# Patient Record
Sex: Female | Born: 1970 | Race: White | Hispanic: No | Marital: Single | Smoking: Never smoker
Health system: Southern US, Community
[De-identification: ages and names within clinical notes are randomized; demographics above are authoritative.]

## PROBLEM LIST (undated history)

## (undated) DIAGNOSIS — T50901A Poisoning by unspecified drugs, medicaments and biological substances, accidental (unintentional), initial encounter: Secondary | ICD-10-CM

## (undated) DIAGNOSIS — I10 Essential (primary) hypertension: Secondary | ICD-10-CM

## (undated) DIAGNOSIS — F419 Anxiety disorder, unspecified: Secondary | ICD-10-CM

## (undated) DIAGNOSIS — F1190 Opioid use, unspecified, uncomplicated: Secondary | ICD-10-CM

## (undated) HISTORY — PX: ABDOMINAL HYSTERECTOMY: SHX81

## (undated) HISTORY — PX: VESICOVAGINAL FISTULA CLOSURE W/ TAH: SUR271

---

## 2001-12-08 ENCOUNTER — Other Ambulatory Visit: Admission: RE | Admit: 2001-12-08 | Discharge: 2001-12-08 | Payer: Self-pay | Admitting: Obstetrics and Gynecology

## 2003-10-29 ENCOUNTER — Emergency Department (HOSPITAL_COMMUNITY): Admission: EM | Admit: 2003-10-29 | Discharge: 2003-10-29 | Payer: Self-pay | Admitting: Internal Medicine

## 2004-12-29 ENCOUNTER — Ambulatory Visit (HOSPITAL_COMMUNITY): Admission: RE | Admit: 2004-12-29 | Discharge: 2004-12-29 | Payer: Self-pay | Admitting: Obstetrics and Gynecology

## 2005-02-03 ENCOUNTER — Inpatient Hospital Stay (HOSPITAL_COMMUNITY): Admission: RE | Admit: 2005-02-03 | Discharge: 2005-02-06 | Payer: Self-pay | Admitting: Obstetrics & Gynecology

## 2005-05-20 ENCOUNTER — Ambulatory Visit (HOSPITAL_COMMUNITY): Admission: RE | Admit: 2005-05-20 | Discharge: 2005-05-20 | Payer: Self-pay | Admitting: Pulmonary Disease

## 2007-04-27 ENCOUNTER — Emergency Department (HOSPITAL_COMMUNITY): Admission: EM | Admit: 2007-04-27 | Discharge: 2007-04-27 | Payer: Self-pay | Admitting: Emergency Medicine

## 2007-11-14 ENCOUNTER — Encounter (INDEPENDENT_AMBULATORY_CARE_PROVIDER_SITE_OTHER): Payer: Self-pay | Admitting: General Surgery

## 2007-11-14 ENCOUNTER — Other Ambulatory Visit: Admission: RE | Admit: 2007-11-14 | Discharge: 2007-11-14 | Payer: Self-pay | Admitting: General Surgery

## 2009-07-25 ENCOUNTER — Emergency Department (HOSPITAL_COMMUNITY): Admission: EM | Admit: 2009-07-25 | Discharge: 2009-07-25 | Payer: Self-pay | Admitting: Emergency Medicine

## 2009-10-22 ENCOUNTER — Ambulatory Visit (HOSPITAL_COMMUNITY): Admission: RE | Admit: 2009-10-22 | Discharge: 2009-10-22 | Payer: Self-pay | Admitting: Family Medicine

## 2010-06-03 ENCOUNTER — Ambulatory Visit (HOSPITAL_COMMUNITY): Admission: RE | Admit: 2010-06-03 | Discharge: 2010-06-03 | Payer: Self-pay | Admitting: Family Medicine

## 2010-08-25 ENCOUNTER — Ambulatory Visit (HOSPITAL_COMMUNITY)
Admission: RE | Admit: 2010-08-25 | Discharge: 2010-08-25 | Payer: Self-pay | Source: Home / Self Care | Admitting: Family Medicine

## 2011-01-04 ENCOUNTER — Ambulatory Visit (HOSPITAL_COMMUNITY)
Admission: RE | Admit: 2011-01-04 | Discharge: 2011-01-04 | Payer: Self-pay | Source: Home / Self Care | Attending: Family Medicine | Admitting: Family Medicine

## 2011-01-09 ENCOUNTER — Encounter: Payer: Self-pay | Admitting: Obstetrics and Gynecology

## 2011-05-07 NOTE — Discharge Summary (Signed)
NAMEGEORGEAN, SPAINHOWER              ACCOUNT NO.:  [id number]   MEDICAL RECORD NO.:  [id number]          PATIENT TYPE:  INP   LOCATION:  A419                           FACILITY:   PHYSICIAN:  Lazaro Arms, M.D.   DATE OF BIRTH:  Nov 12, 1971   DATE OF ADMISSION:  DATE OF DISCHARGE:  02/18/2006LH                                 DISCHARGE SUMMARY   DISCHARGE DIAGNOSES:  1.  Status post abdominal hysterectomy.  2.  Status post abdominoplasty.   Please refer to the transcribed history and physical in the hospital.   HOSPITAL COURSE:  The patient was admitted after surgery.  She did quite  well intraoperatively and postoperatively.  She tolerated clear liquids and  a regular diet without symptoms, was ambulatory, and remained afebrile.  She  had an appropriate amount of JP drain output.  Her incision was clean, dry  and intact.  Dressing was removed on post day 2 and not replaced.  The  drains were kept in place for the following week in order to drain the  subcutaneous fat space.  The patient, again, had a completely normal  postoperative course, and was discharged home on post day #3 in good, stable  condition to follow up with the office next week to have staples starting to  be removed and drains removed.   DISCHARGE MEDICATIONS:  She was given Tylox for pain and Levaquin as an  antibiotic.      LHE/MEDQ  D:  02/17/2005  T:  02/17/2005  Job:  [id number]

## 2011-05-07 NOTE — H&P (Signed)
NAMEMARICARMEN, [name]              ACCOUNT NO.:  [id number]   MEDICAL RECORD NO.:  [id number]          PATIENT TYPE:  AMB   LOCATION:  DAY                           FACILITY:  [facility]   PHYSICIAN:  Lazaro Arms, M.D.   DATE OF BIRTH:  02-27-1971   DATE OF ADMISSION:  DATE OF DISCHARGE:  LH                                HISTORY & PHYSICAL   Angelica Alexander is a 40 year old white female, who is status post two C sections and  a tubal ligation with her second C section, who has very heavy and very  painful periods. She has been having to take narcotics now for some time  because of her bad periods.  She has tried oral contraceptive pills with no  success.  Ultrasound of the uterus reveals a thickened endometrium and  slightly enlarged uterus, probably most consistent with adenomyosis.   On examination, she has quite a tender uterus to palpation.  The adnexa is  negative.  Angelica Alexander and I discussed options in detail, and she has decided  that she wants to go forward with a abdominal hysterectomy.  We plan to  leave the ovaries in place if they are normal.   She also has chronic moisture changes of the lower abdomen and chronic yeast  because of redundant abdominal wall from her pregnancies.  As a result, she  is requesting abdominoplasty be performed, and I certainly agree with that  medically. As a result, we will proceed with abdominoplasty as well.   PAST MEDICAL HISTORY:  Negative except for broken foot.   PAST SURGICAL HISTORY:  She has just had the two C sections and tubal  ligation.   PAST OBSTETRICAL HISTORY:  As above.   REVIEW OF SYSTEMS:  As per HPI, otherwise negative.  She has had a few  scattered urinary tract infections in the past.   PHYSICAL EXAMINATION:  VITAL SIGNS:  Weight 173 pounds.  Blood pressure  136/80.  HEENT:  Unremarkable.  NECK:  Thyroid is normal.  LUNGS:  Clear.  HEART:  Regular rate and rhythm without murmur, regurgitation, or gallop.  BREASTS:  Exam is  normal.  EXTREMITIES:  Warm with no edema.  NEUROLOGIC:  Grossly intact.  ABDOMEN:  Exam is benign, no hepatosplenomegaly or masses.  She does have  chronic moisture changes and yeast changes of lower abdomen.  PELVIC:  She has normal external genitalia.  The vagina is pink and moist  without discharge.  The cervix is parous without lesions.  The uterus is  tender in the mid plane.  The adnexa are negative.  RECTAL:  Exam is normal.   IMPRESSION:  1.  Menometrorrhagia.  2.  Dysmenorrhea.  3.  Dyspareunia.  4.  Chronic moisture changes of lower abdomen.   PLAN:  The patient is admitted for abdominal hysterectomy and  abdominoplasty.  She understands the risks, benefits, indications, and  alternatives and will proceed.      LHE/MEDQ  D:  02/02/2005  T:  02/02/2005  Job:  [id number]

## 2011-05-07 NOTE — Op Note (Signed)
NAMESHAINE, MOUNT              ACCOUNT NO.:  [id number]   MEDICAL RECORD NO.:  [id number]          PATIENT TYPE:  AMB   LOCATION:  DAY                           FACILITY:  [facility]   PHYSICIAN:  Lazaro Arms, M.D.   DATE OF BIRTH:  1971/03/15   DATE OF PROCEDURE:  02/03/2005  DATE OF DISCHARGE:                                 OPERATIVE REPORT   PREOPERATIVE DIAGNOSES:  1.  Menometrorrhagia.  2.  Dysmenorrhea.  3.  Dyspareunia.  4.  Small fibroids.  5.  Chronic moisture changes, abdominal wall.   POSTOPERATIVE DIAGNOSES:  1.  Menometrorrhagia.  2.  Dysmenorrhea.  3.  Dyspareunia.  4.  Small fibroids.  5.  Chronic moisture changes, abdominal wall.  6.  Adenomyosis and endometriosis, minimal.   OPERATION PERFORMED:  1.  Abdominal hysterectomy.  2.  Abdominoplasty.   SURGEON:  Lazaro Arms, M.D.   ANESTHESIA:  General endotracheal.   FINDINGS:  The patient had sort of a globally enlarged uterus.  It was very  soft, consistent with adenomyosis.  She had a few specks of endometriosis  that would be classified as minimal in the posterior peritoneum and  uterosacral ligaments bilaterally.  All of it was removed with the specimen.  The ovaries were normal.  The rest of the peritoneal cavity was normal.   DESCRIPTION OF PROCEDURE:  The patient was taken to the operating room and  placed in a supine position where she underwent general endotracheal  anesthesia.  I then spent a good deal of time drawing on the abdomen in  preparation for the planned abdominoplasty. The vagina was prepped, Foley  catheter was placed and the abdomen was prepped in the usual sterile  fashion.  Routine Pfannenstiel incision was made and carried down sharply to  the rectus fascia which was scored in the midline and extended laterally.  The fascia was taken off the muscles superiorly and inferiorly without  difficulty.  The muscles were divided and the peritoneal cavity was entered.  A Balfour  self-retaining retractor was placed and the upper abdomen was  packed away without difficulty.  The uterine cornu were grasped.  The left  round ligament was suture ligated and cut.  The utero-ovarian ligament was  double clamped, cut and double suture ligated with good hemostasis.  The  vesicouterine serosal flap was created on the left.  There were minimal  adhesions despite the patient's history of two cesarean sections.  The right  round ligament was suture ligated and cut.  The utero-ovarian ligament was  cross-clamped, cut and double suture ligated with good hemostasis and the  vesicouterine serosal flap was united in the midline from the right.  Further dissection was performed of the bladder and it was taken down off  the lower uterine segment without difficulty.  The posterior peritoneum was  then cut and the uterine vessels were skeletonized bilaterally.  The uterine  vessels were clamped, cut and suture ligated.  Serial pedicles were taken  down the cervix through the cardinal ligament. Each pedicle being clamped,  cut and transfixion suture ligated.  The vaginal angles were approached.  Curved clamps were placed.  Vaginal angle sutures were placed.  The specimen  was removed and the vagina was closed with interrupted figure of eight  sutures.  The pelvis was irrigated vigorously.  There was good hemostasis of  all pedicles.  The ovaries were wanting to get down next to the vagina so  they were attached loosely to the round ligaments bilaterally to keep them  away from the vaginal cuff.  Interceed was also placed over the vaginal cuff  to prevent postoperative adhesions.  The Balfour retractor was removed. The  packs were removed and all pedicles found to be hemostatic.  The muscles and  peritoneum were reapproximated loosely. The fascia was closed using 0 Vicryl  running.  Subcutaneous tissue was made hemostatic.  I then manually tunneled  under the subcutaneous tissue after  incising it with the electrocautery unit  and tunneled underneath the skin through the subcu beyond the point of my  planned abdominoplasty.  I then sharply removed the skin as drawn on the  abdomen to the iliac crest bilaterally and pretty much halfway between the  pubis and umbilicus in the midline in a smiling fashion.  The abdominal wall  was removed.  Hemostasis was achieved.  Two Jackson-Pratt drains were placed  on either side and sutured down.  Again, subcutaneous tissue was made  hemostatic and irrigated to keep moist.  Subcutaneous sutures were placed  interrupted to bring together the subcutaneous tissue to reapproximate the  skin edges.  When the skin edges were placed together, I then used staples  to close the skin.  The blood loss for the procedure was 300 mL.  The  patient tolerated the procedure well.  She received Ancef 1 g  prophylactically.  A pressure was applied.  An abdominal binder was applied  again to try to create tension on the abdominal wall from the abdominoplasty  to diminish postoperative blood loss from the subcutaneous tissue.  The  drains were draining fine.  The patient was awakened from anesthesia and  taken to recovery room in good stable condition.  All counts were correct.      LHE/MEDQ  D:  02/03/2005  T:  02/03/2005  Job:  [id number]

## 2011-05-08 ENCOUNTER — Emergency Department (HOSPITAL_COMMUNITY)
Admission: EM | Admit: 2011-05-08 | Discharge: 2011-05-08 | Disposition: A | Discharge status: Home / Self Care | Payer: Medicaid Other | Attending: Emergency Medicine | Admitting: Emergency Medicine

## 2011-05-08 DIAGNOSIS — R51 Headache: Secondary | ICD-10-CM | POA: Insufficient documentation

## 2011-05-08 DIAGNOSIS — X58XXXA Exposure to other specified factors, initial encounter: Secondary | ICD-10-CM | POA: Insufficient documentation

## 2011-05-08 DIAGNOSIS — S81009A Unspecified open wound, unspecified knee, initial encounter: Secondary | ICD-10-CM | POA: Insufficient documentation

## 2011-06-03 ENCOUNTER — Emergency Department (HOSPITAL_COMMUNITY)
Admission: EM | Admit: 2011-06-03 | Discharge: 2011-06-03 | Disposition: A | Discharge status: Home / Self Care | Payer: Medicaid Other | Attending: Emergency Medicine | Admitting: Emergency Medicine

## 2011-06-03 ENCOUNTER — Emergency Department (HOSPITAL_COMMUNITY): Payer: Medicaid Other

## 2011-06-03 DIAGNOSIS — I10 Essential (primary) hypertension: Secondary | ICD-10-CM | POA: Insufficient documentation

## 2011-06-03 DIAGNOSIS — IMO0002 Reserved for concepts with insufficient information to code with codable children: Secondary | ICD-10-CM | POA: Insufficient documentation

## 2011-06-03 DIAGNOSIS — W268XXA Contact with other sharp object(s), not elsewhere classified, initial encounter: Secondary | ICD-10-CM | POA: Insufficient documentation

## 2011-06-03 DIAGNOSIS — F329 Major depressive disorder, single episode, unspecified: Secondary | ICD-10-CM | POA: Insufficient documentation

## 2011-06-03 DIAGNOSIS — G43909 Migraine, unspecified, not intractable, without status migrainosus: Secondary | ICD-10-CM | POA: Insufficient documentation

## 2011-06-03 DIAGNOSIS — F3289 Other specified depressive episodes: Secondary | ICD-10-CM | POA: Insufficient documentation

## 2011-06-03 DIAGNOSIS — Y998 Other external cause status: Secondary | ICD-10-CM | POA: Insufficient documentation

## 2011-06-03 DIAGNOSIS — Z79899 Other long term (current) drug therapy: Secondary | ICD-10-CM | POA: Insufficient documentation

## 2012-02-17 ENCOUNTER — Emergency Department (HOSPITAL_COMMUNITY): Payer: Self-pay

## 2012-02-17 ENCOUNTER — Emergency Department (HOSPITAL_COMMUNITY)
Admission: EM | Admit: 2012-02-17 | Discharge: 2012-02-17 | Disposition: A | Discharge status: Home / Self Care | Payer: Self-pay | Attending: Emergency Medicine | Admitting: Emergency Medicine

## 2012-02-17 ENCOUNTER — Other Ambulatory Visit: Payer: Self-pay

## 2012-02-17 ENCOUNTER — Encounter (HOSPITAL_COMMUNITY): Payer: Self-pay | Admitting: Emergency Medicine

## 2012-02-17 DIAGNOSIS — R0789 Other chest pain: Secondary | ICD-10-CM | POA: Insufficient documentation

## 2012-02-17 DIAGNOSIS — I10 Essential (primary) hypertension: Secondary | ICD-10-CM | POA: Insufficient documentation

## 2012-02-17 DIAGNOSIS — H538 Other visual disturbances: Secondary | ICD-10-CM | POA: Insufficient documentation

## 2012-02-17 HISTORY — DX: Anxiety disorder, unspecified: F41.9

## 2012-02-17 HISTORY — DX: Essential (primary) hypertension: I10

## 2012-02-17 LAB — BASIC METABOLIC PANEL
BUN: 15 mg/dL (ref 6–23)
CO2: 27 mEq/L (ref 19–32)
Calcium: 10.3 mg/dL (ref 8.4–10.5)
Chloride: 100 mEq/L (ref 96–112)
Creatinine, Ser: 0.97 mg/dL (ref 0.50–1.10)
GFR calc Af Amer: 85 mL/min — ABNORMAL LOW (ref >90)
GFR calc non Af Amer: 73 mL/min — ABNORMAL LOW (ref >90)
Glucose, Bld: 114 mg/dL — ABNORMAL HIGH (ref 70–99)
Potassium: 3.7 mEq/L (ref 3.5–5.1)
Sodium: 137 mEq/L (ref 135–145)

## 2012-02-17 LAB — CBC
HCT: 41 % (ref 36.0–46.0)
Hemoglobin: 13.7 g/dL (ref 12.0–15.0)
MCH: 29.8 pg (ref 26.0–34.0)
MCHC: 33.4 g/dL (ref 30.0–36.0)
MCV: 89.1 fL (ref 78.0–100.0)
Platelets: 324 10*3/uL (ref 150–400)
RBC: 4.6 MIL/uL (ref 3.87–5.11)
RDW: 13.4 % (ref 11.5–15.5)
WBC: 8.7 10*3/uL (ref 4.0–10.5)

## 2012-02-17 LAB — TROPONIN I: Troponin I: <0.3 ng/mL (ref <0.30)

## 2012-02-17 NOTE — ED Provider Notes (Signed)
History   Scribed for [employer]. Colon Branch, MD, the patient was seen in [facility]. The chart was scribed by Gilman Schmidt. The patients care was started at 9:42 PM.   CSN: [id number]  Arrival date & time 02/17/12  2111   First MD Initiated Contact with Patient 02/17/12 2123      Chief Complaint  Patient presents with  . Chest Pain  . Hypertension  . Blurred Vision    (Consider location/radiation/quality/duration/timing/severity/associated sxs/prior treatment) HPI Angelica Alexander is a 41 y.o. female with a history of HTN and Anxiety who presents to the Emergency Department complaining of chest pain onset three days. States it feels like someone is "holding her chest and squeezing it". Symptoms have worsened in last two weeks. Pt also notes elevated blood pressure levels and blurred vision (being unable to focus). Pt states she does not sleep and notes recent stress. States she was on Ambien but stopped taking it because she did not want to get addicted. Notes occasional swelling in hands and feet that subside during the night. Pt also notes that she was on Lisinopril but has stopped taking it.  There are no other associated symptoms and no other alleviating or aggravating factors.   PCP: Dr. Sudie Bailey Past Medical History  Diagnosis Date  . Hypertension   . Anxiety     Past Surgical History  Procedure Date  . Vesicovaginal fistula closure w/ tah     No family history on file.  History  Substance Use Topics  . Smoking status: Never Smoker   . Smokeless tobacco: Not on file  . Alcohol Use: Yes     socially    OB History    Grav Para Term Preterm Abortions TAB SAB Ect Mult Living                  Review of Systems  Eyes:       Blurred vision   Cardiovascular: Positive for chest pain.  All other systems reviewed and are negative.    Allergies  Review of patient's allergies indicates no known allergies.  Home Medications  No current outpatient prescriptions on  file.  BP 151/94  Pulse 72  Temp(Src) 98 F (36.7 C) (Oral)  Resp 20  Ht 5\' 4"  (1.626 m)  Wt 190 lb (86.183 kg)  BMI 32.61 kg/m2  SpO2 100%  Physical Exam  Constitutional: She appears well-developed and well-nourished.  HENT:  Head: Normocephalic and atraumatic.  Eyes: Conjunctivae are normal. Pupils are equal, round, and reactive to light.  Neck: Neck supple. No tracheal deviation present. No thyromegaly present.  Cardiovascular: Normal rate and regular rhythm.   No murmur heard. Pulmonary/Chest: Effort normal and breath sounds normal.  Abdominal: Soft. Bowel sounds are normal. She exhibits no distension. There is no tenderness.  Musculoskeletal: Normal range of motion. She exhibits no edema and no tenderness.  Neurological: She is alert. Coordination normal.  Skin: Skin is warm and dry. No rash noted.  Psychiatric: She has a normal mood and affect.    ED Course  Procedures (including critical care time)   Labs Reviewed  CBC  BASIC METABOLIC PANEL  TROPONIN I   No results found.   No diagnosis found.  DIAGNOSTIC STUDIES: Oxygen Saturation is 100% on room air, normal by my interpretation.    Date: 02/17/2012  2127  Rate:72 Rhythm: normal sinus rhythm  QRS Axis: normal  Intervals: normal  ST/T Wave abnormalities: normal  Conduction Disutrbances: none  Narrative Interpretation:  Old EKG Reviewed:NONE AVAILABLE  LABS Results for orders placed during the hospital encounter of 02/17/12  CBC      Component Value Range   WBC 8.7  4.0 - 10.5 (K/uL)   RBC 4.60  3.87 - 5.11 (MIL/uL)   Hemoglobin 13.7  12.0 - 15.0 (g/dL)   HCT 47.8  29.5 - 62.1 (%)   MCV 89.1  78.0 - 100.0 (fL)   MCH 29.8  26.0 - 34.0 (pg)   MCHC 33.4  30.0 - 36.0 (g/dL)   RDW 30.8  65.7 - 84.6 (%)   Platelets 324  150 - 400 (K/uL)  BASIC METABOLIC PANEL      Component Value Range   Sodium 137  135 - 145 (mEq/L)   Potassium 3.7  3.5 - 5.1 (mEq/L)   Chloride 100  96 - 112 (mEq/L)   CO2 27   19 - 32 (mEq/L)   Glucose, Bld 114 (*) 70 - 99 (mg/dL)   BUN 15  6 - 23 (mg/dL)   Creatinine, Ser 9.62  0.50 - 1.10 (mg/dL)   Calcium 95.2  8.4 - 10.5 (mg/dL)   GFR calc non Af Amer 73 (*) >90 (mL/min)   GFR calc Af Amer 85 (*) >90 (mL/min)  TROPONIN I      Component Value Range   Troponin I <0.30  <0.30 (ng/mL)    Radiology: Chest Portable 1 View. Reviewed by me. IMPRESSION: Negative chest. Original Report Authenticated By: Elsie Stain, M.D.   COORDINATION OF CARE: 9:42pm:  - Patient evaluated by ED physician, Chest Portable, Troponin, CBC, BMP, EKG ordered     MDM  Patient with chest discomfort and elevated blood pressure for several days. Formerly on antihypertensive. EKG, chest xray and labs are unremarkable. Pt stable in ED with no significant deterioration in condition.The patient appears reasonably screened and/or stabilized for discharge and I doubt any other medical condition or other [facility] requiring further screening, evaluation, or treatment in the ED at this time prior to discharge.  I personally performed the services described in this documentation, which was scribed in my presence. The recorded information has been reviewed and considered.   MDM Reviewed: nursing note and vitals Interpretation: labs, ECG and x-ray           Nicoletta Dress. Colon Branch, MD 02/17/12 2243

## 2012-02-17 NOTE — ED Notes (Signed)
Patient states that she has had chest pressure for about 3 days now. States that her blood pressure has been elevated as well. Also states that her vision is going blurry today and stress makes the pain worse.

## 2012-02-17 NOTE — Discharge Instructions (Signed)
YOUR BLOOD WORK, HEART NUMBERS, EKG AND CHEST XRAY WERE ALL NORMAL HERE TONIGHT. TAKE YOUR BLOOD PRESSURES FOR TWO WEEKS AND TAKE THE READING TO DR. Sudie Bailey. HE MAY WANT TO RESTART BLOOD PRESSURE MEDICINE.    Chest Pain, Nonspecific Today you have had an exam and tests to determine a specific cause for your chest pain. It is often hard to give a specific diagnosis as the cause of one's chest pain. There is always a chance that your pain could be related to something serious, like a heart attack or a blood clot in the lungs. You need to follow up with your caregiver for further evaluation. More lab tests or other studies such as x-rays, an electrocardiogram, stress testing, or cardiac imaging may be needed to find the cause of your pain. Most of the time nonspecific chest pain will be improved within 2-3 days of rest and mild pain medicine. For the next few days avoid physical exertion or activities that bring on the pain. Do not smoke or drink alcohol until all your symptoms are gone. Quitting smoking is the number one way to reduce your risk for heart and lung disease. Call your caregiver for routine follow-up as advised.  SEEK IMMEDIATE MEDICAL CARE IF:  You develop increased chest pain, or pain that radiates to the arm, neck, jaw, back or abdomen.   You develop shortness of breath, increasing cough or coughing up blood.   You have severe back or abdominal pain, nausea or vomiting.   You develop severe weakness, fainting, fever or chills.  Document Released: 12/06/2005 Document Revised: 08/18/2011 Document Reviewed: 05/26/2007 [facility] Patient Information 2012 [place], [place].

## 2012-11-29 ENCOUNTER — Ambulatory Visit (HOSPITAL_COMMUNITY)
Admission: RE | Admit: 2012-11-29 | Discharge: 2012-11-29 | Disposition: A | Discharge status: Home / Self Care | Payer: BC Managed Care – PPO | Source: Ambulatory Visit | Attending: Family Medicine | Admitting: Family Medicine

## 2012-11-29 ENCOUNTER — Other Ambulatory Visit (HOSPITAL_COMMUNITY): Payer: Self-pay | Admitting: Family Medicine

## 2012-11-29 DIAGNOSIS — M25559 Pain in unspecified hip: Secondary | ICD-10-CM

## 2012-11-29 DIAGNOSIS — M545 Low back pain, unspecified: Secondary | ICD-10-CM | POA: Insufficient documentation

## 2013-09-30 ENCOUNTER — Emergency Department (HOSPITAL_COMMUNITY)
Admission: EM | Admit: 2013-09-30 | Discharge: 2013-09-30 | Disposition: A | Discharge status: Home / Self Care | Payer: BC Managed Care – PPO | Attending: Emergency Medicine | Admitting: Emergency Medicine

## 2013-09-30 ENCOUNTER — Encounter (HOSPITAL_COMMUNITY): Payer: Self-pay | Admitting: Emergency Medicine

## 2013-09-30 DIAGNOSIS — Z8659 Personal history of other mental and behavioral disorders: Secondary | ICD-10-CM | POA: Insufficient documentation

## 2013-09-30 DIAGNOSIS — I10 Essential (primary) hypertension: Secondary | ICD-10-CM | POA: Insufficient documentation

## 2013-09-30 DIAGNOSIS — Z880 Allergy status to penicillin: Secondary | ICD-10-CM | POA: Insufficient documentation

## 2013-09-30 DIAGNOSIS — S01512A Laceration without foreign body of oral cavity, initial encounter: Secondary | ICD-10-CM

## 2013-09-30 DIAGNOSIS — S01502A Unspecified open wound of oral cavity, initial encounter: Secondary | ICD-10-CM | POA: Insufficient documentation

## 2013-09-30 MED ORDER — HYDROCODONE-ACETAMINOPHEN 5-325 MG PO TABS
1.0000 | ORAL_TABLET | Freq: Once | ORAL | Status: AC
  Administered 2013-09-30: 1 via ORAL
  Filled 2013-09-30: qty 1

## 2013-09-30 MED ORDER — HYDROCODONE-ACETAMINOPHEN 5-325 MG PO TABS: 1.0000 | ORAL_TABLET | ORAL | Status: DC | PRN
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
## 2013-09-30 NOTE — ED Notes (Signed)
Patient given crackers per request.

## 2013-09-30 NOTE — ED Notes (Signed)
Small bruise noted on the left upper eyelid of patient. Patient states it is "a little sore"
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
## 2013-10-03 NOTE — ED Provider Notes (Signed)
Medical screening examination/treatment/procedure(s) were performed by non-physician practitioner and as supervising physician I was immediately available for consultation/collaboration.  Elmira Olkowski F Ashly Goethe, MD 10/03/13 1541 

## 2013-11-01 ENCOUNTER — Ambulatory Visit (HOSPITAL_COMMUNITY)
Admission: RE | Admit: 2013-11-01 | Discharge: 2013-11-01 | Disposition: A | Discharge status: Home / Self Care | Payer: BC Managed Care – PPO | Source: Ambulatory Visit | Attending: Family Medicine | Admitting: Family Medicine

## 2013-11-01 ENCOUNTER — Other Ambulatory Visit (HOSPITAL_COMMUNITY): Payer: Self-pay | Admitting: Family Medicine

## 2013-11-01 DIAGNOSIS — M545 Low back pain, unspecified: Secondary | ICD-10-CM

## 2013-11-01 DIAGNOSIS — M25559 Pain in unspecified hip: Secondary | ICD-10-CM | POA: Insufficient documentation

## 2013-11-01 DIAGNOSIS — M2550 Pain in unspecified joint: Secondary | ICD-10-CM

## 2013-12-13 IMAGING — CR DG HIP W/ PELVIS BILAT
5 series · 5 of 5 positions shown · non-contrast
Comparison: None.

CLINICAL DATA: Lumbago, bilateral hip pain.

EXAM:
BILATERAL HIP WITH PELVIS - 4+ VIEW

[view not recorded (1 of 5)]
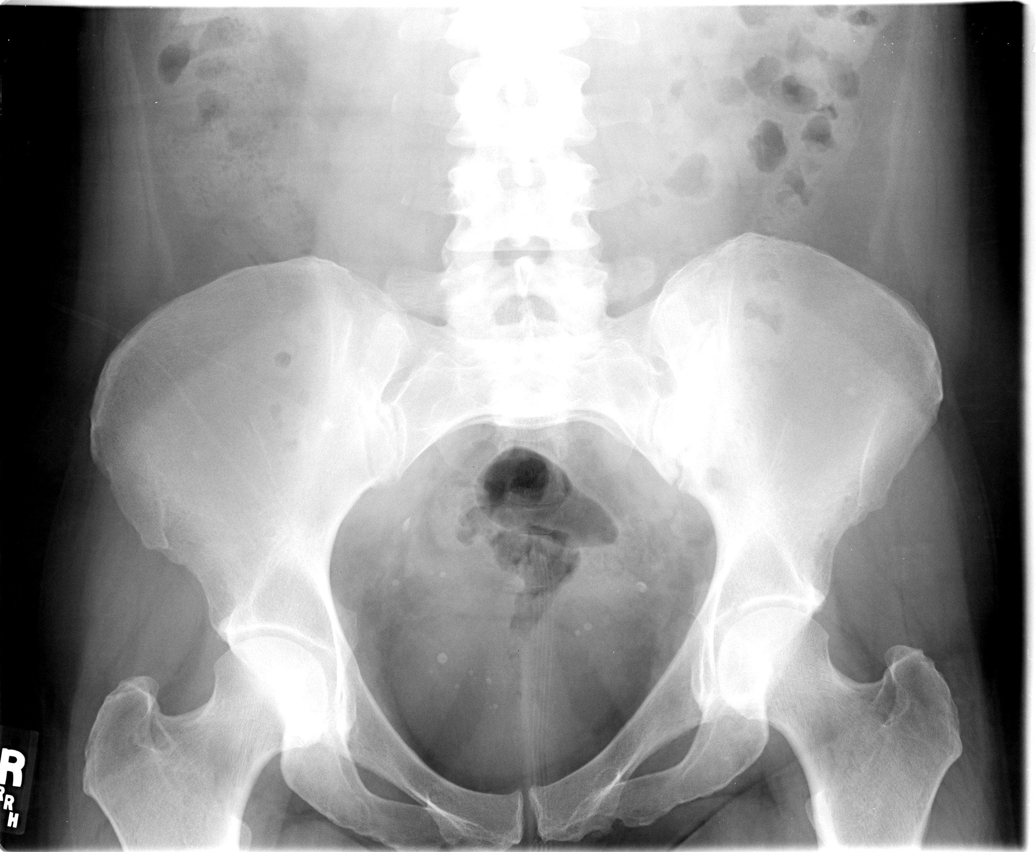

[view not recorded (2 of 5)]
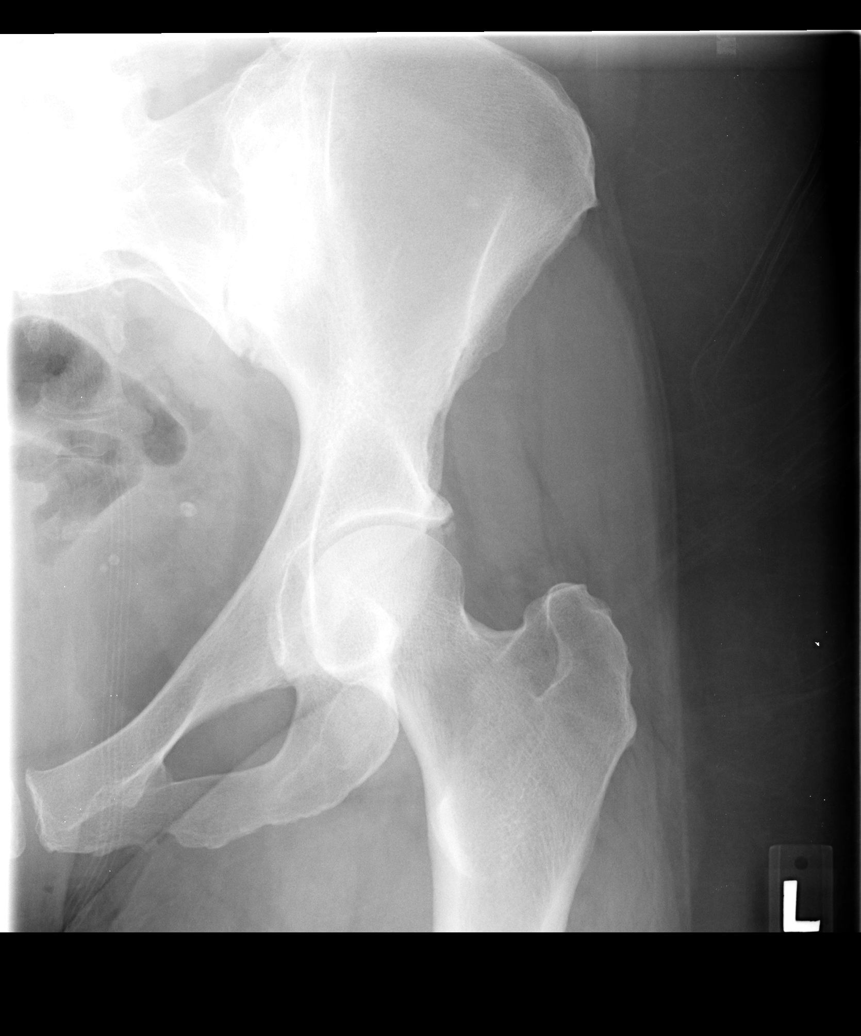

[view not recorded (3 of 5)]
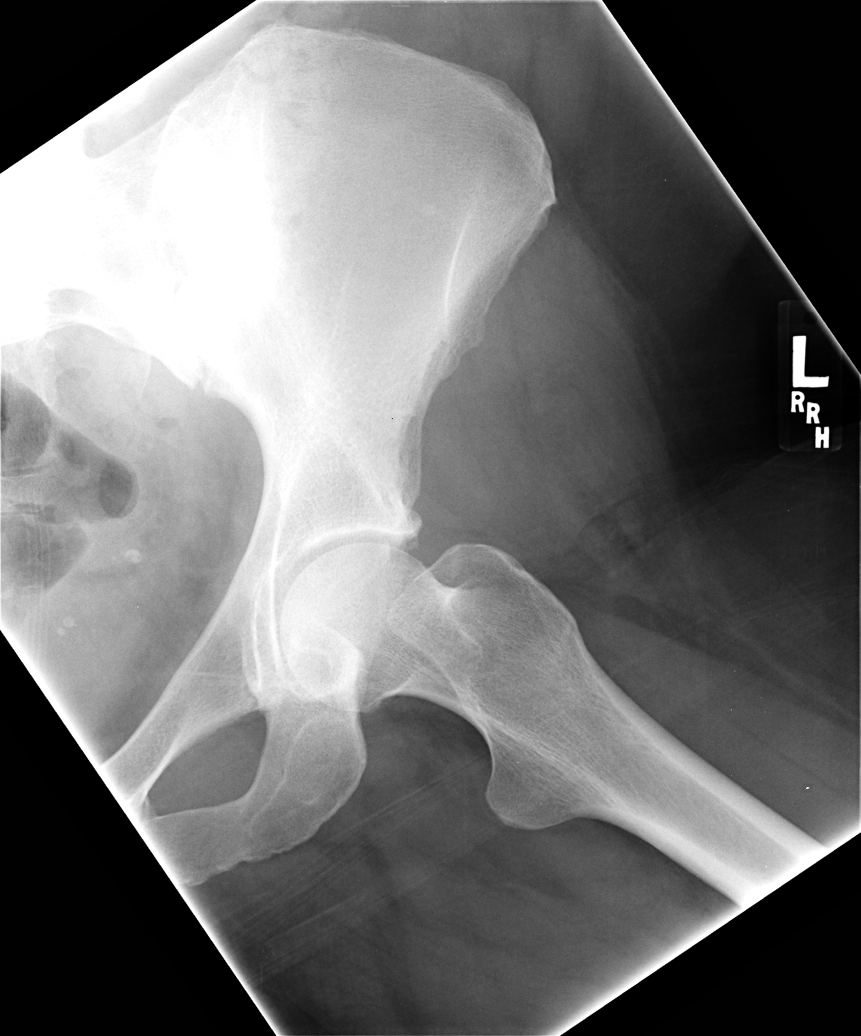

[view not recorded (4 of 5)]
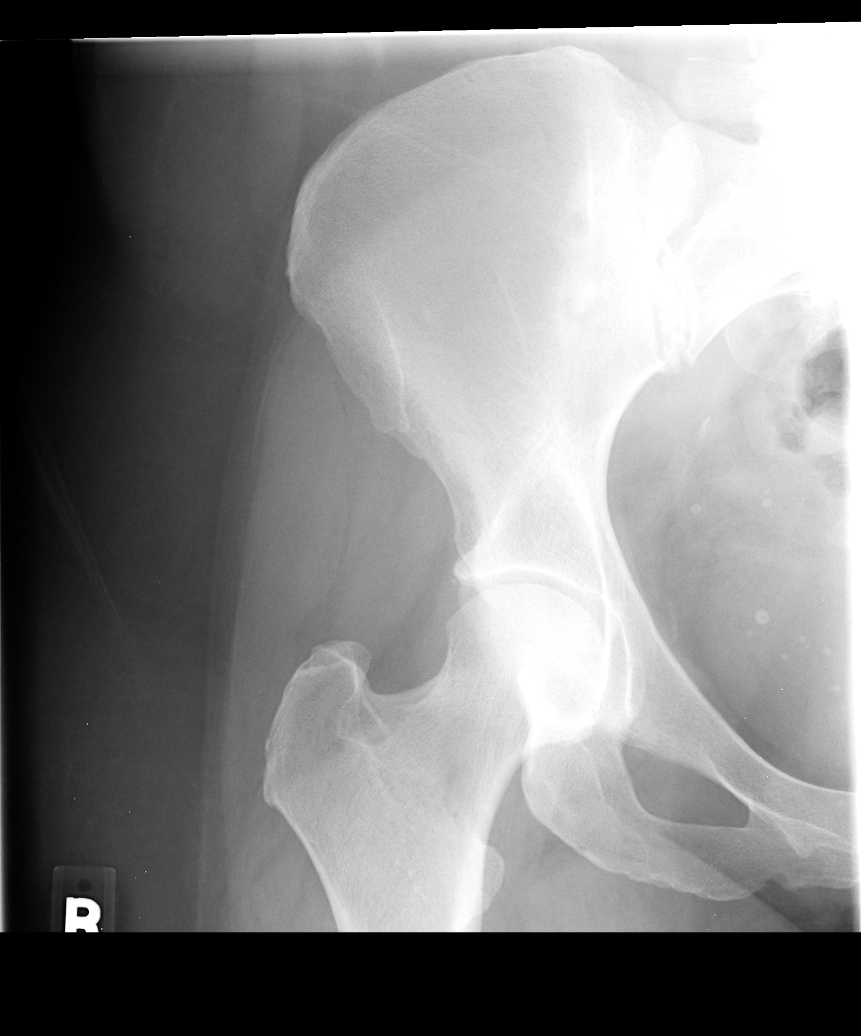

[view not recorded (5 of 5)]
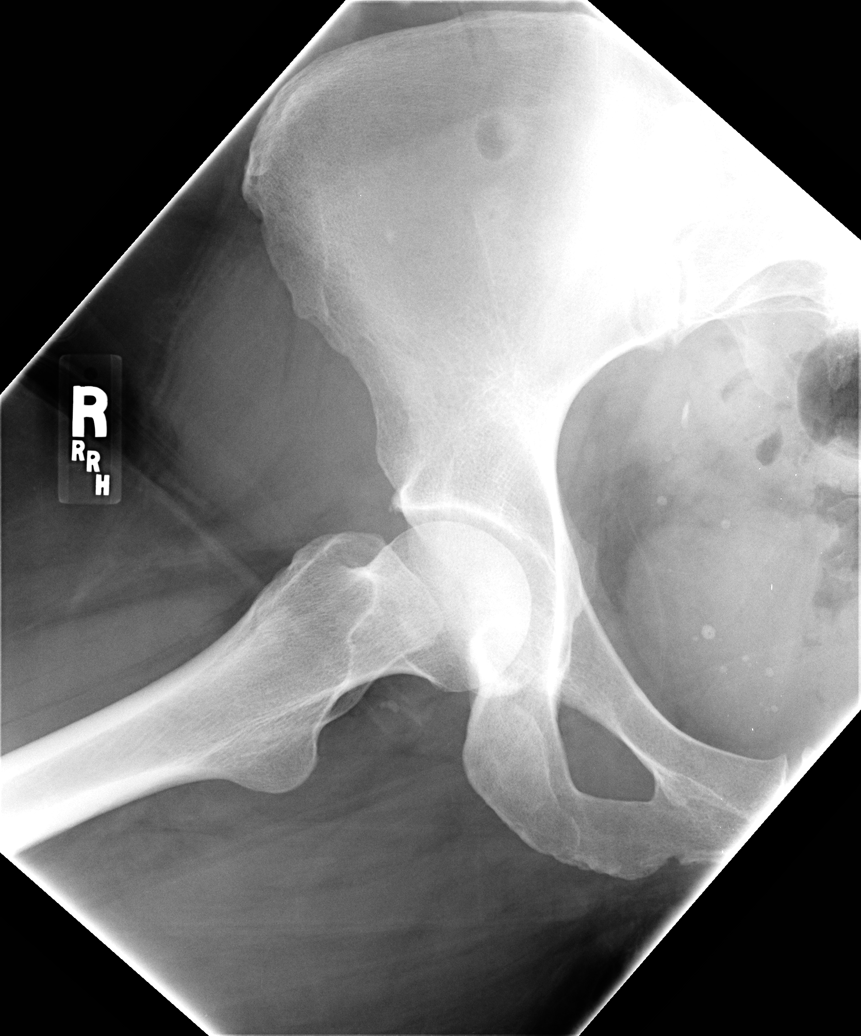

[5 of 5 positions shown; findings below may reference images not displayed]

FINDINGS: There is no acute fracture or dislocation. The joint spaces are
maintained. The SI joints are normal.
IMPRESSION: No acute osseous injury of bilateral hips.

## 2014-01-14 ENCOUNTER — Other Ambulatory Visit (HOSPITAL_COMMUNITY): Payer: Self-pay | Admitting: Family Medicine

## 2014-01-14 DIAGNOSIS — Z139 Encounter for screening, unspecified: Secondary | ICD-10-CM

## 2014-01-18 ENCOUNTER — Ambulatory Visit (HOSPITAL_COMMUNITY): Payer: BC Managed Care – PPO

## 2014-04-09 ENCOUNTER — Telehealth: Payer: Self-pay | Admitting: Gastroenterology

## 2014-04-09 ENCOUNTER — Ambulatory Visit: Payer: BC Managed Care – PPO | Admitting: Gastroenterology

## 2014-04-09 ENCOUNTER — Encounter: Payer: Self-pay | Admitting: Gastroenterology

## 2014-04-09 NOTE — Telephone Encounter (Signed)
Pt was a no show

## 2014-04-09 NOTE — Telephone Encounter (Signed)
MAILED LETTER °

## 2014-06-03 ENCOUNTER — Emergency Department (HOSPITAL_COMMUNITY)
Admission: EM | Admit: 2014-06-03 | Discharge: 2014-06-03 | Discharge status: Against Medical Advice | Payer: BC Managed Care – PPO

## 2018-03-17 ENCOUNTER — Other Ambulatory Visit (HOSPITAL_COMMUNITY): Payer: Self-pay | Admitting: *Deleted

## 2018-03-17 DIAGNOSIS — IMO0002 Reserved for concepts with insufficient information to code with codable children: Secondary | ICD-10-CM

## 2018-03-17 DIAGNOSIS — R229 Localized swelling, mass and lump, unspecified: Principal | ICD-10-CM

## 2018-03-28 ENCOUNTER — Ambulatory Visit (HOSPITAL_COMMUNITY)
Admission: RE | Admit: 2018-03-28 | Discharge: 2018-03-28 | Disposition: A | Discharge status: Home / Self Care | Payer: PRIVATE HEALTH INSURANCE | Source: Ambulatory Visit | Attending: *Deleted | Admitting: *Deleted

## 2018-03-28 ENCOUNTER — Encounter (HOSPITAL_COMMUNITY): Payer: Self-pay

## 2018-03-28 DIAGNOSIS — N644 Mastodynia: Secondary | ICD-10-CM | POA: Diagnosis present

## 2018-03-28 DIAGNOSIS — N6002 Solitary cyst of left breast: Secondary | ICD-10-CM | POA: Diagnosis not present

## 2018-03-28 DIAGNOSIS — IMO0002 Reserved for concepts with insufficient information to code with codable children: Secondary | ICD-10-CM

## 2018-03-28 DIAGNOSIS — R229 Localized swelling, mass and lump, unspecified: Principal | ICD-10-CM

## 2018-03-28 DIAGNOSIS — N6321 Unspecified lump in the left breast, upper outer quadrant: Secondary | ICD-10-CM | POA: Insufficient documentation

## 2018-03-28 DIAGNOSIS — N6320 Unspecified lump in the left breast, unspecified quadrant: Secondary | ICD-10-CM | POA: Diagnosis present

## 2018-04-27 ENCOUNTER — Encounter: Payer: Self-pay | Admitting: General Surgery

## 2018-04-27 ENCOUNTER — Ambulatory Visit (INDEPENDENT_AMBULATORY_CARE_PROVIDER_SITE_OTHER): Payer: Self-pay | Admitting: General Surgery

## 2018-04-27 VITALS — BP 119/74 | HR 81 | Temp 98.6°F | Resp 16 | Wt 168.0 lb

## 2018-04-27 DIAGNOSIS — N6002 Solitary cyst of left breast: Secondary | ICD-10-CM

## 2018-04-27 NOTE — Patient Instructions (Signed)
Breast Cyst A breast cyst is a sac in the breast that is filled with fluid. Breast cysts are usually noncancerous (benign). They are common among women, and they are most often located in the upper, outer portion of the breast. One or more cysts may develop. They form when fluid builds up inside of the breast glands. There are several types of breast cysts:  Macrocyst. This is a cyst that is about 2 inches (5.1 cm) across (in diameter).  Microcyst. This is a very small cyst that you cannot feel, but it can be seen with imaging tests such as an X-ray of the breast (mammogram) or ultrasound.  Galactocele. This is a cyst that contains milk. It may develop if you suddenly stop breastfeeding.  Breast cysts do not increase your risk of breast cancer. They usually disappear after menopause, unless you take artificial hormones (are on hormone therapy). What are the causes? The exact cause of breast cysts is not known. Possible causes include:  Blockage of tubes (ducts) in the breast glands, which leads to fluid buildup. Duct blockage may result from: ? Fibrocystic breast changes. This is a common, benign condition that occurs when women go through hormonal changes during the menstrual cycle. This is a common cause of multiple breast cysts. ? Overgrowth of breast tissue or breast glands. ? Scar tissue in the breast from previous surgery.  Changes in certain female hormones (estrogen and progesterone).  What increases the risk? You may be more likely to develop breast cysts if you have not gone through menopause. What are the signs or symptoms? Symptoms of a breast cyst may include:  Feeling one or more smooth, round, soft lumps (like grapes) in the breast that are easily moveable. The lump(s) may get bigger and more painful before your period and get smaller after your period.  Breast discomfort or pain.  How is this diagnosed? A cyst can be felt during a physical exam by your health care  provider. A mammogram and ultrasound will be done to confirm the diagnosis. Fluid may be removed from the cyst with a needle (fine-needle aspiration) and tested to make sure the cyst is not cancerous. How is this treated? Treatment may not be necessary. Your health care provider may monitor the cyst to see if it goes away on its own. If the cyst is uncomfortable or gets bigger, or if you do not like how the cyst makes your breast look, you may need treatment. Treatment may include:  Hormone treatment.  Fine-needle aspiration, to drain fluid from the cyst. There is a chance of the cyst coming back (recurring) after aspiration.  Surgery to remove the cyst.  Follow these instructions at home:  See your health care provider regularly. ? Get a yearly physical exam. ? If you are 20-40 years old, get a clinical breast exam every 1-3 years. After age 40, get this exam every year. ? Get mammograms as often as directed.  Do a breast self-exam every month, or as often as directed. Having many breast cysts, or "lumpy" breasts, may make it harder to feel for new lumps. Understand how your breasts normally look and feel, and write down any changes in your breasts so you can tell your health care provider about the changes. A breast self-exam involves: ? Comparing your breasts in the mirror. ? Looking for visible changes in your skin or nipples. ? Feeling for lumps or changes.  Take over-the-counter and prescription medicines only as told by your health care   provider.  Wear a supportive bra, especially when exercising.  Follow instructions from your health care provider about eating and drinking restrictions. ? Avoid caffeine. ? Cut down on salt (sodium) in what you eat and drink, especially before your menstrual period. Too much sodium can cause fluid buildup (retention), breast swelling, and discomfort.  Keep all follow-up visits as told your health care provider. This is important. Contact a  health care provider if:  You feel, or think you feel, a lump in your breast.  You notice that both breasts look or feel different than usual.  Your breast is still causing pain after your menstrual period is over.  You find new lumps or bumps that were not there before.  You feel lumps in your armpit (axilla). Get help right away if:  You have severe pain, tenderness, redness, or warmth in your breast.  You have fluid or blood leaking from your nipple.  Your breast lump becomes hard and painful.  You notice dimpling or wrinkling of the breast or nipple. This information is not intended to replace advice given to you by your health care provider. Make sure you discuss any questions you have with your health care provider. Document Released: 12/06/2005 Document Revised: 08/27/2016 Document Reviewed: 08/27/2016 Elsevier Interactive Patient Education  2017 Elsevier Inc.  

## 2018-04-27 NOTE — Progress Notes (Signed)
Angelica Alexander; [id number]; Mar 15, 1971   HPI Patient is a 47 year old white female who was referred to my care by Dr. Sudie Bailey for evaluation and treatment of left breast cyst.  This was found on routine mammography.  Ultrasound confirmed that this was a left breast cyst, 1 cm in size, and benign in nature.  Patient states that the cyst seems to come and go, but it is not an area where there are multiple nodules present.  She does have family history of breast cancer in relatives of her mother and father.  No immediate family history of breast cancer.  She denies any nipple discharge.  She currently has 0 out of 10 breast pain. Past Medical History:  Diagnosis Date  . Anxiety   . Hypertension     Past Surgical History:  Procedure Laterality Date  . ABDOMINAL HYSTERECTOMY    . CESAREAN SECTION    . VESICOVAGINAL FISTULA CLOSURE W/ TAH      Family History  Problem Relation Age of Onset  . Breast cancer Maternal Aunt   . Breast cancer Maternal Grandmother     Current Outpatient Medications on File Prior to Visit  Medication Sig Dispense Refill  . HYDROcodone-acetaminophen (NORCO/VICODIN) 5-325 MG per tablet Take 1 tablet by mouth every 4 (four) hours as needed for pain. 15 tablet 0   No current facility-administered medications on file prior to visit.     Allergies  Allergen Reactions  . Penicillins     CHILDHOOD ALLERGIES    Social History   Substance and Sexual Activity  Alcohol Use Yes   Comment: socially    Social History   Tobacco Use  Smoking Status Never Smoker  Smokeless Tobacco Never Used    Review of Systems  Constitutional: Positive for malaise/fatigue.  HENT: Negative.   Eyes: Positive for blurred vision.  Respiratory: Negative.   Cardiovascular: Negative.   Gastrointestinal: Negative.   Genitourinary: Negative.   Musculoskeletal: Positive for back pain.  Skin: Negative.   Neurological: Negative.   Endo/Heme/Allergies: Negative.    Psychiatric/Behavioral: Negative.     Objective   Vitals:   04/27/18 0909  BP: 119/74  Pulse: 81  Resp: 16  Temp: 98.6 F (37 C)    Physical Exam  Constitutional: She is oriented to person, place, and time. She appears well-developed and well-nourished.  HENT:  Head: Normocephalic and atraumatic.  Neck: Normal range of motion. Neck supple.  Cardiovascular: Normal rate, regular rhythm and normal heart sounds. Exam reveals no gallop and no friction rub.  No murmur heard. Pulmonary/Chest: Effort normal and breath sounds normal. No stridor. No respiratory distress. She has no wheezes. She has no rales.  Lymphadenopathy:    She has no cervical adenopathy.  Neurological: She is alert and oriented to person, place, and time.  Skin: Skin is warm and dry.  Vitals reviewed. Breast: No dominant mass, nipple discharge, dimpling in either breast.  It is difficult to discern the cystic lesion in the left breast.  No axillary lymphadenopathy noted.    Mammogram and ultrasound reports reviewed  Assessment  Left breast cyst, benign Remote family history of breast cancer Plan   I reassured the patient that the cyst does not appear malignant in nature.  Should she still remain concerned, a follow-up ultrasound the left breast can be done in 6 months.  Should she have enlargement of the area, she was instructed to return to my office for examination.  She should maintain her yearly screening mammograms.  Follow-up expectantly.

## 2018-06-01 ENCOUNTER — Encounter (HOSPITAL_COMMUNITY): Payer: Self-pay | Admitting: Emergency Medicine

## 2018-06-01 ENCOUNTER — Emergency Department (HOSPITAL_COMMUNITY)
Admission: EM | Admit: 2018-06-01 | Discharge: 2018-06-01 | Disposition: A | Discharge status: Home / Self Care | Payer: Worker's Compensation | Attending: Emergency Medicine | Admitting: Emergency Medicine

## 2018-06-01 ENCOUNTER — Emergency Department (HOSPITAL_COMMUNITY): Payer: Worker's Compensation

## 2018-06-01 ENCOUNTER — Other Ambulatory Visit: Payer: Self-pay

## 2018-06-01 DIAGNOSIS — Y9289 Other specified places as the place of occurrence of the external cause: Secondary | ICD-10-CM | POA: Diagnosis not present

## 2018-06-01 DIAGNOSIS — I10 Essential (primary) hypertension: Secondary | ICD-10-CM | POA: Insufficient documentation

## 2018-06-01 DIAGNOSIS — W010XXA Fall on same level from slipping, tripping and stumbling without subsequent striking against object, initial encounter: Secondary | ICD-10-CM | POA: Diagnosis not present

## 2018-06-01 DIAGNOSIS — Y990 Civilian activity done for income or pay: Secondary | ICD-10-CM | POA: Diagnosis not present

## 2018-06-01 DIAGNOSIS — Y939 Activity, unspecified: Secondary | ICD-10-CM | POA: Diagnosis not present

## 2018-06-01 DIAGNOSIS — M25562 Pain in left knee: Secondary | ICD-10-CM | POA: Diagnosis not present

## 2018-06-01 DIAGNOSIS — R2242 Localized swelling, mass and lump, left lower limb: Secondary | ICD-10-CM | POA: Diagnosis not present

## 2018-06-01 MED ORDER — IBUPROFEN 600 MG PO TABS: 600.0000 mg | ORAL_TABLET | Freq: Four times a day (QID) | ORAL | 0 refills | Status: DC | PRN

## 2018-06-01 NOTE — ED Provider Notes (Signed)
[facility] EMERGENCY DEPARTMENT Provider Note   CSN: [id number] Arrival date & time: 06/01/18  1446     History   Chief Complaint Chief Complaint  Patient presents with  . Knee Pain    HPI Angelica Alexander is a 47 y.o. female.  Angelica Alexander is a 47 y.o. Female with a history of anxiety and hypertension, who presents to the emergency department for evaluation of a fall 2 nights ago at work.  Patient reports she fell landing on her left knee, did not hit her head, denies any other injuries.  Immediately after the fall her knee felt fine but over the past day or so it has become increasingly painful with some mild swelling.  Pain is primarily located over the anterior and lateral aspects of the knee.  Pain is made worse with palpation, range of motion and weightbearing.  She denies any redness or warmth.  No fevers or chills.  Patient has been using an Ace wrap and took some ibuprofen just prior to arrival with minimal improvement.  She denies any numbness or tingling.  No pain in the ankle, foot or hip.  No prior surgery or injury to the knee.     Past Medical History:  Diagnosis Date  . Anxiety   . Hypertension     There are no active problems to display for this patient.   Past Surgical History:  Procedure Laterality Date  . ABDOMINAL HYSTERECTOMY    . CESAREAN SECTION    . VESICOVAGINAL FISTULA CLOSURE W/ TAH       OB History   None      Home Medications    Prior to Admission medications   Medication Sig Start Date End Date Taking? Authorizing Provider  HYDROcodone-acetaminophen (NORCO/VICODIN) 5-325 MG per tablet Take 1 tablet by mouth every 4 (four) hours as needed for pain. 09/30/13   Burgess AmorIdol, Julie, PA-C    Family History Family History  Problem Relation Age of Onset  . Breast cancer Maternal Aunt   . Breast cancer Maternal Grandmother     Social History Social History   Tobacco Use  . Smoking status: Never Smoker  . Smokeless tobacco: Never  Used  Substance Use Topics  . Alcohol use: Yes    Comment: socially  . Drug use: No     Allergies   Penicillins   Review of Systems Review of Systems  Constitutional: Negative for chills and fever.  Musculoskeletal: Positive for arthralgias and joint swelling.  Skin: Negative for color change and rash.  Neurological: Negative for weakness and numbness.     Physical Exam Updated Vital Signs BP (!) 133/91 (BP Location: Right Arm)   Pulse 89   Temp 98.1 F (36.7 C) (Oral)   Resp 14   Ht 5\' 3"  (1.6 m)   Wt 77.1 kg (170 lb)   SpO2 100%   BMI 30.11 kg/m   Physical Exam  Constitutional: She appears well-developed and well-nourished. No distress.  HENT:  Head: Normocephalic and atraumatic.  Eyes: Right eye exhibits no discharge. Left eye exhibits no discharge.  Pulmonary/Chest: Effort normal. No respiratory distress.  Musculoskeletal:  Left knee with mild swelling, tenderness to palpation over the anterior and lateral aspects, no appreciable erythema, ecchymosis, or abrasion.  No warmth.  Patient is able to flex and extend the knee with some discomfort.  No pain at the hips or ankle with normal range of motion.  No appreciable swelling or tenderness over the calf.  2+  DP and TP pulses, sensation intact.  5/5 strength with dorsi and plantar flexion  Neurological: She is alert. Coordination normal.  Skin: Skin is warm and dry. Capillary refill takes less than 2 seconds. She is not diaphoretic.  Psychiatric: She has a normal mood and affect. Her behavior is normal.  Nursing note and vitals reviewed.    ED Treatments / Results  Labs (all labs ordered are listed, but only abnormal results are displayed) Labs Reviewed - No data to display  EKG None  Radiology Dg Knee Complete 4 Views Left  Result Date: 06/01/2018 CLINICAL DATA:  Fall 1 day ago with persistent knee pain, initial encounter EXAM: LEFT KNEE - COMPLETE 4+ VIEW COMPARISON:  None. FINDINGS: No evidence of  fracture, dislocation, or joint effusion. No evidence of arthropathy or other focal bone abnormality. Soft tissues are unremarkable. IMPRESSION: No acute abnormality noted. Electronically Signed   By: Alcide Clever M.D.   On: 06/01/2018 15:36    Procedures Procedures (including critical care time)  Medications Ordered in ED Medications - No data to display   Initial Impression / Assessment and Plan / ED Course  I have reviewed the triage vital signs and the nursing notes.  Pertinent labs & imaging results that were available during my care of the patient were reviewed by me and considered in my medical decision making (see chart for details).  Presents for evaluation of left knee pain after fall at work 2 nights ago.  Pain has been worsening over the past day mild swelling appreciated.  No evidence of joint effusion.  No erythema or warmth.  No concern for septic arthritis.  Range of motion intact with some discomfort.  The left lower extremity is neurovascularly intact.  X-ray shows no evidence of acute abnormality.  Likely soft tissue injury from fall.  Will place patient in knee immobilizer and crutches, encourage her to use ibuprofen and Tylenol as well as ice and elevation.  If pain not improving with conservative treatment patient to follow-up with orthopedics.  Return precautions discussed.  Patient expresses understanding and is in agreement with plan.  Final Clinical Impressions(s) / ED Diagnoses   Final diagnoses:  Acute pain of left knee    ED Discharge Orders        Ordered    ibuprofen (ADVIL,MOTRIN) 600 MG tablet  Every 6 hours PRN     06/01/18 1616       Dartha Lodge, [place] 06/01/18 1618    Mancel Bale, MD 06/02/18 0002

## 2018-06-01 NOTE — Discharge Instructions (Signed)
Your x-ray shows no evidence of fracture or dislocation.  Please use knee immobilizer and crutches.  May continue to use Tylenol and ibuprofen for pain as well as over-the-counter muscle rubs such as Biofreeze.  Please ice and elevate the knee as much as possible.  If your symptoms are not improving after 5 to 7 days please follow-up with Dr. Romeo AppleHarrison with orthopedics.  Return to the emergency department for significantly worsening pain, redness, swelling, fevers, numbness or weakness or any other new or concerning symptoms.

## 2018-06-01 NOTE — ED Triage Notes (Signed)
Patient c/o left knee pain. Per patient fell last night at work. Patient states she thought it would get better but pain and swelling is progressively getting worse. Patient reports taking 800mg  ibuprofen 1 hour ago with no relief. Patient has knee wrapped with ace bandage.

## 2018-06-07 ENCOUNTER — Telehealth: Payer: Self-pay | Admitting: Orthopedic Surgery

## 2018-06-07 NOTE — Telephone Encounter (Signed)
Patient called to relay she had been treated at [facility] room for a knee injury, which is work-related/workers comp. Relayed worker's comp protocol which includes approval from [employer] insurer.  Patient states has paperwork from employer, which she may bring to our office and/or have employer or insurance contact person call our office.

## 2018-06-12 NOTE — Telephone Encounter (Signed)
Spoke with patient again on Friday, 06/09/18; patient had not yet heard anything from her employer or workers comp, no has our office. Aware can be scheduled once we are contacted directly, and once approval is received

## 2020-01-03 ENCOUNTER — Other Ambulatory Visit: Payer: Self-pay

## 2020-01-07 ENCOUNTER — Other Ambulatory Visit: Payer: Self-pay

## 2020-01-07 ENCOUNTER — Ambulatory Visit: Payer: Self-pay | Attending: Internal Medicine

## 2020-01-07 DIAGNOSIS — U071 COVID-19: Secondary | ICD-10-CM | POA: Insufficient documentation

## 2020-01-07 DIAGNOSIS — Z20822 Contact with and (suspected) exposure to covid-19: Secondary | ICD-10-CM

## 2020-01-08 LAB — NOVEL CORONAVIRUS, NAA: SARS-CoV-2, NAA: DETECTED — AB

## 2021-05-12 ENCOUNTER — Other Ambulatory Visit (HOSPITAL_COMMUNITY): Payer: Self-pay | Admitting: Nurse Practitioner

## 2021-05-12 DIAGNOSIS — N63 Unspecified lump in unspecified breast: Secondary | ICD-10-CM

## 2021-06-16 ENCOUNTER — Ambulatory Visit (HOSPITAL_COMMUNITY): Payer: Self-pay

## 2021-06-16 ENCOUNTER — Encounter (HOSPITAL_COMMUNITY): Payer: Self-pay

## 2021-06-16 ENCOUNTER — Inpatient Hospital Stay (HOSPITAL_COMMUNITY): Admission: RE | Admit: 2021-06-16 | Payer: Self-pay | Source: Ambulatory Visit

## 2023-02-06 ENCOUNTER — Other Ambulatory Visit: Payer: Self-pay

## 2023-02-06 ENCOUNTER — Emergency Department (HOSPITAL_COMMUNITY)
Admission: EM | Admit: 2023-02-06 | Discharge: 2023-02-06 | Disposition: A | Discharge status: Home / Self Care | Payer: Self-pay | Attending: Emergency Medicine | Admitting: Emergency Medicine

## 2023-02-06 ENCOUNTER — Encounter (HOSPITAL_COMMUNITY): Payer: Self-pay

## 2023-02-06 ENCOUNTER — Emergency Department (HOSPITAL_COMMUNITY): Payer: Self-pay

## 2023-02-06 DIAGNOSIS — T402X1A Poisoning by other opioids, accidental (unintentional), initial encounter: Secondary | ICD-10-CM | POA: Insufficient documentation

## 2023-02-06 DIAGNOSIS — T50901A Poisoning by unspecified drugs, medicaments and biological substances, accidental (unintentional), initial encounter: Secondary | ICD-10-CM

## 2023-02-06 HISTORY — DX: Poisoning by unspecified drugs, medicaments and biological substances, accidental (unintentional), initial encounter: T50.901A

## 2023-02-06 HISTORY — DX: Opioid use, unspecified, uncomplicated: F11.90

## 2023-02-06 LAB — CBC WITH DIFFERENTIAL/PLATELET
Abs Immature Granulocytes: 0.23 10*3/uL — ABNORMAL HIGH (ref 0.00–0.07)
Basophils Absolute: 0.1 10*3/uL (ref 0.0–0.1)
Basophils Relative: 1 %
Eosinophils Absolute: 0.8 10*3/uL — ABNORMAL HIGH (ref 0.0–0.5)
Eosinophils Relative: 7 %
HCT: 44.4 % (ref 36.0–46.0)
Hemoglobin: 14.3 g/dL (ref 12.0–15.0)
Immature Granulocytes: 2 %
Lymphocytes Relative: 12 %
Lymphs Abs: 1.6 10*3/uL (ref 0.7–4.0)
MCH: 28.1 pg (ref 26.0–34.0)
MCHC: 32.2 g/dL (ref 30.0–36.0)
MCV: 87.2 fL (ref 80.0–100.0)
Monocytes Absolute: 0.6 10*3/uL (ref 0.1–1.0)
Monocytes Relative: 5 %
Neutro Abs: 9.3 10*3/uL — ABNORMAL HIGH (ref 1.7–7.7)
Neutrophils Relative %: 73 %
Platelets: 278 10*3/uL (ref 150–400)
RBC: 5.09 MIL/uL (ref 3.87–5.11)
RDW: 14.1 % (ref 11.5–15.5)
WBC: 12.6 10*3/uL — ABNORMAL HIGH (ref 4.0–10.5)
nRBC: 0 % (ref 0.0–0.2)

## 2023-02-06 LAB — COMPREHENSIVE METABOLIC PANEL
ALT: 27 U/L (ref 0–44)
AST: 29 U/L (ref 15–41)
Albumin: 4.1 g/dL (ref 3.5–5.0)
Alkaline Phosphatase: 68 U/L (ref 38–126)
Anion gap: 13 (ref 5–15)
BUN: 19 mg/dL (ref 6–20)
CO2: 24 mmol/L (ref 22–32)
Calcium: 9.8 mg/dL (ref 8.9–10.3)
Chloride: 100 mmol/L (ref 98–111)
Creatinine, Ser: 1.03 mg/dL — ABNORMAL HIGH (ref 0.44–1.00)
GFR, Estimated: >60 mL/min (ref >60)
Glucose, Bld: 126 mg/dL — ABNORMAL HIGH (ref 70–99)
Potassium: 3.4 mmol/L — ABNORMAL LOW (ref 3.5–5.1)
Sodium: 137 mmol/L (ref 135–145)
Total Bilirubin: 0.5 mg/dL (ref 0.3–1.2)
Total Protein: 7.4 g/dL (ref 6.5–8.1)

## 2023-02-06 LAB — ETHANOL: Alcohol, Ethyl (B): <10 mg/dL (ref <10)

## 2023-02-06 MED ORDER — SODIUM CHLORIDE 0.9 % IV BOLUS
1000.0000 mL | Freq: Once | INTRAVENOUS | Status: AC
  Administered 2023-02-06: 1000 mL via INTRAVENOUS

## 2023-02-06 NOTE — ED Notes (Signed)
Patient Alert and oriented to baseline. Stable and ambulatory to baseline. Patient verbalized understanding of the discharge instructions.  Patient belongings were taken by the patient.   

## 2023-02-06 NOTE — ED Provider Notes (Signed)
9:02 AM Patient awake, alert, requesting discharge.   Carmin Muskrat, MD 02/06/23 [id number]

## 2023-02-06 NOTE — ED Notes (Signed)
Instructed family to encourage pt to provide urine sample, as this is the last pending test. Fluids running at this time. Pt has purewick in place.

## 2023-02-06 NOTE — ED Triage Notes (Signed)
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 Pt is alert and oriented upon arrival. Pt has bloody drainage from [place] Pel[place]Pt on room air at this time.

## 2023-02-06 NOTE — ED Notes (Signed)
Pt has family at bedside

## 2023-02-06 NOTE — Discharge Instructions (Signed)
Monitor your condition carefully and do not hesitate to return here for concerning changes in your condition.    Please follow-up with your physician.

## 2023-02-06 NOTE — ED Notes (Signed)
Pt alert to verbal. Alert and oriented X4. Family remains at bedside.

## 2023-02-06 NOTE — ED Notes (Signed)
2 warm blankets applied over pt

## 2023-02-06 NOTE — ED Provider Notes (Signed)
[facility]  Provider Note  CSN: [id number] Arrival date & time: 02/06/23 0242  History Chief Complaint  Patient presents with   Drug Overdose    Angelica Alexander is a 52 y.o. female brought by EMS for suspected accidental drug overdose. EMS reports they were called for CPR in progress. Found patient unresponsive and apneic with bystander CPR. She was noted to have pin point pupils, drug paraphernalia around the house and family reported history of heroin abuse. She was given narcan with return to consciousness and subsequently stable vitals. She reports she does not remember anything including any prior drug use.    Home Medications Prior to Admission medications   Medication Sig Start Date End Date Taking? Authorizing Provider  HYDROcodone-acetaminophen (NORCO/VICODIN) 5-325 MG per tablet Take 1 tablet by mouth every 4 (four) hours as needed for pain. 09/30/13   Idol, Almyra Free, PA-C  ibuprofen (ADVIL,MOTRIN) 600 MG tablet Take 1 tablet (600 mg total) by mouth every 6 (six) hours as needed. 06/01/18   Jacqlyn Larsen, PA-C     Allergies    Penicillins   Review of Systems   Review of Systems Please see HPI for pertinent positives and negatives  Physical Exam BP (!) 172/105   Pulse 89   Temp (!) 97.2 F (36.2 C) (Oral)   Resp 16   Ht 5' 3"$  (1.6 m)   Wt 77.1 kg   SpO2 98%   BMI 30.11 kg/m   Physical Exam Vitals and nursing note reviewed.  Constitutional:      Appearance: Normal appearance.  HENT:     Head: Normocephalic and atraumatic.     Nose: Nose normal.     Mouth/Throat:     Mouth: Mucous membranes are moist.  Eyes:     Extraocular Movements: Extraocular movements intact.     Conjunctiva/sclera: Conjunctivae normal.  Cardiovascular:     Rate and Rhythm: Normal rate.  Pulmonary:     Effort: Pulmonary effort is normal.     Breath sounds: Normal breath sounds.  Abdominal:     General: Abdomen is flat.      Palpations: Abdomen is soft.     Tenderness: There is no abdominal tenderness.  Musculoskeletal:        General: No swelling. Normal range of motion.     Cervical back: Neck supple.  Skin:    General: Skin is warm and dry.  Neurological:     General: No focal deficit present.     Mental Status: She is alert.     Cranial Nerves: No cranial nerve deficit.     Sensory: No sensory deficit.     Motor: No weakness.  Psychiatric:        Mood and Affect: Mood normal.     ED Results / Procedures / Treatments   EKG None  Procedures Procedures  Medications Ordered in the ED Medications  sodium chloride 0.9 % bolus 1,000 mL ( Intravenous Rate/Dose Verify 02/06/23 [id number])    Initial Impression and Plan  Patient here with suspected accidental opioid overdose. Awake and alert now. No recent ED visits, will check labs and continue to observe in the ED for re-sedation.   ED Course   Clinical Course as of 02/06/23 0724  Sun Feb 06, 2023  0354 CBC with mild leukocytosis, otherwise normal.  [CS]  0405 CMP is unremarkable. EtOH is neg.  [CS]  [id number] I personally viewed the images from radiology studies and  agree with radiologist interpretation: CXR is clear [CS]  [phone] Care of the patient signed out to Dr. Vanita Panda at the change of shift pending waking up and clearance for discharge.  [CS]    Clinical Course User Index [CS] Truddie Hidden, MD     MDM Rules/Calculators/A&P Medical Decision Making Problems Addressed: Accidental drug overdose, initial encounter: acute illness or injury  Amount and/or Complexity of Data Reviewed Labs: ordered. Decision-making details documented in ED Course. Radiology: ordered and independent interpretation performed. Decision-making details documented in ED Course.     Final Clinical Impression(s) / ED Diagnoses Final diagnoses:  Accidental drug overdose, initial encounter    Rx / DC Orders ED Discharge Orders     None        Truddie Hidden, MD 02/06/23 [phone]

## 2023-07-25 ENCOUNTER — Ambulatory Visit: Payer: Self-pay | Admitting: Internal Medicine

## 2023-07-27 ENCOUNTER — Encounter: Payer: Self-pay | Admitting: Family Medicine

## 2023-08-16 ENCOUNTER — Emergency Department (HOSPITAL_COMMUNITY): Admission: EM | Admit: 2023-08-16 | Discharge: 2023-08-16 | Discharge status: Against Medical Advice | Payer: Self-pay

## 2023-09-03 ENCOUNTER — Emergency Department (HOSPITAL_COMMUNITY)
Admission: EM | Admit: 2023-09-03 | Discharge: 2023-09-04 | Disposition: A | Discharge status: Home / Self Care | Payer: Self-pay | Attending: Emergency Medicine | Admitting: Emergency Medicine

## 2023-09-03 ENCOUNTER — Other Ambulatory Visit: Payer: Self-pay

## 2023-09-03 ENCOUNTER — Encounter (HOSPITAL_COMMUNITY): Payer: Self-pay

## 2023-09-03 DIAGNOSIS — T40711A Poisoning by cannabis, accidental (unintentional), initial encounter: Secondary | ICD-10-CM | POA: Insufficient documentation

## 2023-09-03 DIAGNOSIS — R4182 Altered mental status, unspecified: Secondary | ICD-10-CM | POA: Insufficient documentation

## 2023-09-03 DIAGNOSIS — T50901A Poisoning by unspecified drugs, medicaments and biological substances, accidental (unintentional), initial encounter: Secondary | ICD-10-CM

## 2023-09-03 DIAGNOSIS — T402X1A Poisoning by other opioids, accidental (unintentional), initial encounter: Secondary | ICD-10-CM | POA: Insufficient documentation

## 2023-09-03 DIAGNOSIS — T424X1A Poisoning by benzodiazepines, accidental (unintentional), initial encounter: Secondary | ICD-10-CM | POA: Insufficient documentation

## 2023-09-03 NOTE — ED Provider Notes (Signed)
[place] EMERGENCY DEPARTMENT AT [facility] Provider Note   CSN: [id number] Arrival date & time: 09/03/23  2341     History {Add pertinent medical, surgical, social history, OB history to HPI:1} Chief Complaint  Patient presents with   Drug Overdose    Angelica Alexander is a 52 y.o. female.  Brought to the emergency department by husband who suspects that she overdosed.  He thinks that she took fentanyl or heroin.  Patient somnolent at arrival, awakens during triage process.  Patient reports that she "hurts all over".       Home Medications Prior to Admission medications   Medication Sig Start Date End Date Taking? Authorizing Provider  HYDROcodone-acetaminophen (NORCO/VICODIN) 5-325 MG per tablet Take 1 tablet by mouth every 4 (four) hours as needed for pain. 09/30/13   Idol, Raynelle Fanning, PA-C  ibuprofen (ADVIL,MOTRIN) 600 MG tablet Take 1 tablet (600 mg total) by mouth every 6 (six) hours as needed. 06/01/18   Dartha Lodge, PA-C      Allergies    Penicillins    Review of Systems   Review of Systems  Physical Exam Updated Vital Signs Temp 97.9 F (36.6 C) (Oral)  Physical Exam Vitals and nursing note reviewed.  Constitutional:      General: She is not in acute distress.    Appearance: She is well-developed.  HENT:     Head: Normocephalic and atraumatic.     Mouth/Throat:     Mouth: Mucous membranes are moist.  Eyes:     General: Vision grossly intact. Gaze aligned appropriately.     Extraocular Movements: Extraocular movements intact.     Conjunctiva/sclera: Conjunctivae normal.  Cardiovascular:     Rate and Rhythm: Normal rate and regular rhythm.     Pulses: Normal pulses.     Heart sounds: Normal heart sounds, S1 normal and S2 normal. No murmur heard.    No friction rub. No gallop.  Pulmonary:     Effort: Pulmonary effort is normal. No respiratory distress.     Breath sounds: Normal breath sounds.  Abdominal:     General: Bowel sounds are  normal.     Palpations: Abdomen is soft.     Tenderness: There is no abdominal tenderness. There is no guarding or rebound.     Hernia: No hernia is present.  Musculoskeletal:        General: No swelling.     Cervical back: Full passive range of motion without pain, normal range of motion and neck supple. No spinous process tenderness or muscular tenderness. Normal range of motion.     Right lower leg: No edema.     Left lower leg: No edema.  Skin:    General: Skin is warm and dry.     Capillary Refill: Capillary refill takes less than 2 seconds.     Findings: No ecchymosis, erythema, rash or wound.  Neurological:     General: No focal deficit present.     Mental Status: She is lethargic.     Cranial Nerves: Cranial nerves 2-12 are intact.     Sensory: Sensation is intact.     Motor: Motor function is intact.     Coordination: Coordination is intact.     Comments: Sleeping in the wheelchair on the way back to the room, was able to be awakened and transferred to the bed by herself.  Falls back asleep when not stimulated.  Psychiatric:        Attention and Perception:  Attention normal.        Mood and Affect: Mood normal.        Speech: Speech normal.        Behavior: Behavior normal.     ED Results / Procedures / Treatments   Labs (all labs ordered are listed, but only abnormal results are displayed) Labs Reviewed  CBC WITH DIFFERENTIAL/PLATELET  COMPREHENSIVE METABOLIC PANEL  ETHANOL  RAPID URINE DRUG SCREEN, HOSP PERFORMED  URINALYSIS, ROUTINE W REFLEX MICROSCOPIC    EKG None  Radiology No results found.  Procedures Procedures  {Document cardiac monitor, telemetry assessment procedure when appropriate:1}  Medications Ordered in ED Medications - No data to display  ED Course/ Medical Decision Making/ A&P   {   Click here for ABCD2, HEART and other calculatorsREFRESH Note before signing :1}                              Medical Decision Making Amount and/or  Complexity of Data Reviewed Labs: ordered.   ***  {Document critical care time when appropriate:1} {Document review of labs and clinical decision tools ie heart score, Chads2Vasc2 etc:1}  {Document your independent review of radiology images, and any outside records:1} {Document your discussion with family members, caretakers, and with consultants:1} {Document social determinants of health affecting pt's care:1} {Document your decision making why or why not admission, treatments were needed:1} Final Clinical Impression(s) / ED Diagnoses Final diagnoses:  None    Rx / DC Orders ED Discharge Orders     None

## 2023-09-03 NOTE — ED Triage Notes (Signed)
Pt brought in by BF after taking fentanyl and heroin, but unknown amount. Pt has hx of overdose on heroin. Pt states she has not taken anything. Pt thrashing around in the stretcher and shaking uncontrollably.

## 2023-09-04 LAB — CBC WITH DIFFERENTIAL/PLATELET
Abs Immature Granulocytes: 0.03 10*3/uL (ref 0.00–0.07)
Basophils Absolute: 0.1 10*3/uL (ref 0.0–0.1)
Basophils Relative: 1 %
Eosinophils Absolute: 0.5 10*3/uL (ref 0.0–0.5)
Eosinophils Relative: 6 %
HCT: 39.2 % (ref 36.0–46.0)
Hemoglobin: 12.8 g/dL (ref 12.0–15.0)
Immature Granulocytes: 0 %
Lymphocytes Relative: 25 %
Lymphs Abs: 2.3 10*3/uL (ref 0.7–4.0)
MCH: 28 pg (ref 26.0–34.0)
MCHC: 32.7 g/dL (ref 30.0–36.0)
MCV: 85.8 fL (ref 80.0–100.0)
Monocytes Absolute: 0.6 10*3/uL (ref 0.1–1.0)
Monocytes Relative: 6 %
Neutro Abs: 5.5 10*3/uL (ref 1.7–7.7)
Neutrophils Relative %: 62 %
Platelets: 386 10*3/uL (ref 150–400)
RBC: 4.57 MIL/uL (ref 3.87–5.11)
RDW: 14.5 % (ref 11.5–15.5)
WBC: 9 10*3/uL (ref 4.0–10.5)
nRBC: 0 % (ref 0.0–0.2)

## 2023-09-04 LAB — COMPREHENSIVE METABOLIC PANEL
ALT: 19 U/L (ref 0–44)
AST: 17 U/L (ref 15–41)
Albumin: 3.9 g/dL (ref 3.5–5.0)
Alkaline Phosphatase: 64 U/L (ref 38–126)
Anion gap: 7 (ref 5–15)
BUN: 16 mg/dL (ref 6–20)
CO2: 28 mmol/L (ref 22–32)
Calcium: 9 mg/dL (ref 8.9–10.3)
Chloride: 101 mmol/L (ref 98–111)
Creatinine, Ser: 1.01 mg/dL — ABNORMAL HIGH (ref 0.44–1.00)
GFR, Estimated: >60 mL/min (ref >60)
Glucose, Bld: 104 mg/dL — ABNORMAL HIGH (ref 70–99)
Potassium: 4.1 mmol/L (ref 3.5–5.1)
Sodium: 136 mmol/L (ref 135–145)
Total Bilirubin: 0.5 mg/dL (ref 0.3–1.2)
Total Protein: 6.9 g/dL (ref 6.5–8.1)

## 2023-09-04 LAB — RAPID URINE DRUG SCREEN, HOSP PERFORMED
Amphetamines: POSITIVE — AB
Barbiturates: NOT DETECTED
Benzodiazepines: POSITIVE — AB
Cocaine: NOT DETECTED
Opiates: POSITIVE — AB
Tetrahydrocannabinol: POSITIVE — AB

## 2023-09-04 LAB — URINALYSIS, ROUTINE W REFLEX MICROSCOPIC
Bilirubin Urine: NEGATIVE
Glucose, UA: NEGATIVE mg/dL
Hgb urine dipstick: NEGATIVE
Ketones, ur: NEGATIVE mg/dL
Leukocytes,Ua: NEGATIVE
Nitrite: NEGATIVE
Protein, ur: NEGATIVE mg/dL
Specific Gravity, Urine: 1.015 (ref 1.005–1.030)
pH: 7 (ref 5.0–8.0)

## 2023-09-04 LAB — ETHANOL: Alcohol, Ethyl (B): <10 mg/dL (ref <10)

## 2023-09-04 NOTE — ED Notes (Signed)
Discharge instructions reviewed and opportunity for questions or concerns provided.   Significant other present to provide a safe ride home.   Refuses discharge blood pressure.   Escorted to vehicle via wheelchair.

## 2023-09-07 ENCOUNTER — Emergency Department (HOSPITAL_COMMUNITY)
Admission: EM | Admit: 2023-09-07 | Discharge: 2023-09-07 | Discharge status: Against Medical Advice | Payer: Self-pay | Attending: Emergency Medicine | Admitting: Emergency Medicine

## 2023-09-07 DIAGNOSIS — R464 Slowness and poor responsiveness: Secondary | ICD-10-CM | POA: Insufficient documentation

## 2023-09-07 DIAGNOSIS — T50901A Poisoning by unspecified drugs, medicaments and biological substances, accidental (unintentional), initial encounter: Secondary | ICD-10-CM | POA: Insufficient documentation

## 2023-09-07 DIAGNOSIS — R000 Tachycardia, unspecified: Secondary | ICD-10-CM | POA: Insufficient documentation

## 2023-09-07 NOTE — ED Provider Notes (Signed)
   [place] EMERGENCY DEPARTMENT AT [facility]  Provider Note  CSN: [id number] Arrival date & time: 09/07/23 3086  History Chief Complaint  Patient presents with   Drug Overdose    Angelica Alexander is a 52 y.o. female with history of polysubstance abuse and accidental overdose brought by EMS from home for unresponsive. Given narcan enroute and awake on arrival. Here on 9/14 for same and eventually discharged. She is not able to tell me what she took tonight but denies any SI. She is adamant she wants to leave.    Home Medications Prior to Admission medications   Medication Sig Start Date End Date Taking? Authorizing Provider  HYDROcodone-acetaminophen (NORCO/VICODIN) 5-325 MG per tablet Take 1 tablet by mouth every 4 (four) hours as needed for pain. 09/30/13   Idol, Raynelle Fanning, PA-C  ibuprofen (ADVIL,MOTRIN) 600 MG tablet Take 1 tablet (600 mg total) by mouth every 6 (six) hours as needed. 06/01/18   Dartha Lodge, PA-C     Allergies    Penicillins   Review of Systems   Review of Systems Please see HPI for pertinent positives and negatives  Physical Exam BP (!) 173/107 (BP Location: Left Arm)   Pulse 98   Temp 98 F (36.7 C) (Oral)   Resp 18   SpO2 100%   Physical Exam Vitals and nursing note reviewed.  Constitutional:      General: She is not in acute distress. HENT:     Head: Normocephalic and atraumatic.     Nose: Nose normal.  Eyes:     Extraocular Movements: Extraocular movements intact.     Pupils: Pupils are equal, round, and reactive to light.  Cardiovascular:     Rate and Rhythm: Tachycardia present.  Pulmonary:     Effort: Pulmonary effort is normal. No respiratory distress.  Musculoskeletal:        General: Normal range of motion.     Cervical back: Neck supple.  Skin:    Findings: No rash (on exposed skin).  Neurological:     Mental Status: She is alert and oriented to person, place, and time.  Psychiatric:        Mood and Affect: Mood  normal.     ED Results / Procedures / Treatments   EKG None  Procedures Procedures  Medications Ordered in the ED Medications - No data to display  Initial Impression and Plan  Patient here with EMS for presumed overdose, she denies SI. She is awake alert and oriented. She is adamant that she wants to leave. I recommended she stay for observation to ensure she does not have any respiratory issues when Narcan wears off. She understands the risks of leaving and reiterates that she is comfortable going home. She was advised to stop using illicit drugs. She is amenable to getting outpatient resource guide. Advised she can return the ED at any time for re-evaluation.   ED Course       MDM Rules/Calculators/A&P Medical Decision Making Problems Addressed: Accidental overdose, initial encounter: acute illness or injury that poses a threat to life or bodily functions  Risk Decision regarding hospitalization.     Final Clinical Impression(s) / ED Diagnoses Final diagnoses:  Accidental overdose, initial encounter    Rx / DC Orders ED Discharge Orders     None        Pollyann Savoy, MD 09/07/23 [phone]

## 2023-09-07 NOTE — ED Triage Notes (Addendum)
Pt BIB [facility] after overdose on unknown substance, upon EMS arrival pt lying on porch wet, brief period of unconsciousness but came around after 1mg  IV Narcan; pt alert during triage; normally uses Fentanyl but thought she was out, pt denies self harm

## 2023-09-07 NOTE — ED Notes (Signed)
Attempted to call pt son per pt request for transport home, call unable to be completed, pt reports she does not have her phone and does not know of any other numbers to call but thinks someone will come soon to pick her up

## 2023-09-07 NOTE — ED Notes (Addendum)
EDP at bedside to assess pt, pt reports wanting to leave AMA, pt is a&o x 4 and denies self-harm, EDP discussed risks of leaving and benefits of staying

## 2024-08-31 DIAGNOSIS — Z419 Encounter for procedure for purposes other than remedying health state, unspecified: Secondary | ICD-10-CM | POA: Diagnosis not present

## 2024-09-27 DIAGNOSIS — G4709 Other insomnia: Secondary | ICD-10-CM | POA: Diagnosis not present

## 2024-10-03 ENCOUNTER — Emergency Department (HOSPITAL_COMMUNITY): Admitting: Certified Registered Nurse Anesthetist

## 2024-10-03 ENCOUNTER — Other Ambulatory Visit: Payer: Self-pay

## 2024-10-03 ENCOUNTER — Encounter (HOSPITAL_COMMUNITY): Payer: Self-pay

## 2024-10-03 ENCOUNTER — Encounter (HOSPITAL_COMMUNITY)
Admission: EM | Disposition: A | Discharge status: Home / Self Care | Payer: Self-pay | Source: Home / Self Care | Attending: Emergency Medicine

## 2024-10-03 ENCOUNTER — Emergency Department (HOSPITAL_COMMUNITY)

## 2024-10-03 ENCOUNTER — Ambulatory Visit (HOSPITAL_COMMUNITY)
Admission: EM | Admit: 2024-10-03 | Discharge: 2024-10-03 | Disposition: A | Discharge status: Home / Self Care | Attending: Emergency Medicine | Admitting: Emergency Medicine

## 2024-10-03 DIAGNOSIS — S51851A Open bite of right forearm, initial encounter: Secondary | ICD-10-CM | POA: Insufficient documentation

## 2024-10-03 DIAGNOSIS — W540XXA Bitten by dog, initial encounter: Secondary | ICD-10-CM | POA: Insufficient documentation

## 2024-10-03 DIAGNOSIS — S51811A Laceration without foreign body of right forearm, initial encounter: Secondary | ICD-10-CM | POA: Diagnosis not present

## 2024-10-03 DIAGNOSIS — Z23 Encounter for immunization: Secondary | ICD-10-CM | POA: Diagnosis not present

## 2024-10-03 DIAGNOSIS — I10 Essential (primary) hypertension: Secondary | ICD-10-CM | POA: Diagnosis not present

## 2024-10-03 DIAGNOSIS — S59911A Unspecified injury of right forearm, initial encounter: Secondary | ICD-10-CM

## 2024-10-03 DIAGNOSIS — M7989 Other specified soft tissue disorders: Secondary | ICD-10-CM | POA: Diagnosis not present

## 2024-10-03 HISTORY — PX: INCISION AND DRAINAGE OF WOUND: SHX1803

## 2024-10-03 LAB — CBC WITH DIFFERENTIAL/PLATELET
Abs Immature Granulocytes: 0.07 K/uL (ref 0.00–0.07)
Basophils Absolute: 0.1 K/uL (ref 0.0–0.1)
Basophils Relative: 1 %
Eosinophils Absolute: 0.1 K/uL (ref 0.0–0.5)
Eosinophils Relative: 1 %
HCT: 36 % (ref 36.0–46.0)
Hemoglobin: 11.9 g/dL — ABNORMAL LOW (ref 12.0–15.0)
Immature Granulocytes: 0 %
Lymphocytes Relative: 7 %
Lymphs Abs: 1.1 K/uL (ref 0.7–4.0)
MCH: 27.4 pg (ref 26.0–34.0)
MCHC: 33.1 g/dL (ref 30.0–36.0)
MCV: 82.9 fL (ref 80.0–100.0)
Monocytes Absolute: 0.8 K/uL (ref 0.1–1.0)
Monocytes Relative: 5 %
Neutro Abs: 13.9 K/uL — ABNORMAL HIGH (ref 1.7–7.7)
Neutrophils Relative %: 86 %
Platelets: 395 K/uL (ref 150–400)
RBC: 4.34 MIL/uL (ref 3.87–5.11)
RDW: 14.5 % (ref 11.5–15.5)
WBC: 16.1 K/uL — ABNORMAL HIGH (ref 4.0–10.5)
nRBC: 0 % (ref 0.0–0.2)

## 2024-10-03 LAB — BASIC METABOLIC PANEL WITH GFR
Anion gap: 12 (ref 5–15)
BUN: 12 mg/dL (ref 6–20)
CO2: 23 mmol/L (ref 22–32)
Calcium: 9.1 mg/dL (ref 8.9–10.3)
Chloride: 104 mmol/L (ref 98–111)
Creatinine, Ser: 1.24 mg/dL — ABNORMAL HIGH (ref 0.44–1.00)
GFR, Estimated: 53 mL/min — ABNORMAL LOW (ref >60)
Glucose, Bld: 115 mg/dL — ABNORMAL HIGH (ref 70–99)
Potassium: 3.5 mmol/L (ref 3.5–5.1)
Sodium: 139 mmol/L (ref 135–145)

## 2024-10-03 SURGERY — IRRIGATION AND DEBRIDEMENT WOUND
Anesthesia: General | Site: Arm Lower | Laterality: Right

## 2024-10-03 MED ORDER — MIDAZOLAM HCL 2 MG/2ML IJ SOLN
INTRAMUSCULAR | Status: DC | PRN
  Administered 2024-10-03: 2 mg via INTRAVENOUS

## 2024-10-03 MED ORDER — AMOXICILLIN-POT CLAVULANATE 875-125 MG PO TABS: 1.0000 | ORAL_TABLET | Freq: Two times a day (BID) | ORAL | 0 refills | Status: AC

## 2024-10-03 MED ORDER — SODIUM CHLORIDE 0.9 % IV SOLN
2.0000 g | Freq: Once | INTRAVENOUS | Status: AC
  Administered 2024-10-03: 2 g via INTRAVENOUS
  Filled 2024-10-03: qty 20

## 2024-10-03 MED ORDER — OXYCODONE HCL 5 MG PO TABS
ORAL_TABLET | ORAL | Status: AC
  Filled 2024-10-03: qty 1

## 2024-10-03 MED ORDER — BUPIVACAINE HCL (PF) 0.25 % IJ SOLN
INTRAMUSCULAR | Status: AC
  Filled 2024-10-03: qty 30

## 2024-10-03 MED ORDER — SODIUM CHLORIDE 0.9 % IV BOLUS
1000.0000 mL | Freq: Once | INTRAVENOUS | Status: AC
  Administered 2024-10-03: 1000 mL via INTRAVENOUS

## 2024-10-03 MED ORDER — HYDROMORPHONE HCL 1 MG/ML IJ SOLN
INTRAMUSCULAR | Status: AC
  Filled 2024-10-03: qty 1

## 2024-10-03 MED ORDER — DROPERIDOL 2.5 MG/ML IJ SOLN: 0.6250 mg | Freq: Once | INTRAMUSCULAR | Status: DC | PRN

## 2024-10-03 MED ORDER — FENTANYL CITRATE (PF) 50 MCG/ML IJ SOSY
50.0000 ug | PREFILLED_SYRINGE | Freq: Once | INTRAMUSCULAR | Status: AC
  Administered 2024-10-03: 50 ug via INTRAVENOUS
  Filled 2024-10-03: qty 1

## 2024-10-03 MED ORDER — LIDOCAINE 2% (20 MG/ML) 5 ML SYRINGE
INTRAMUSCULAR | Status: AC
  Filled 2024-10-03: qty 5

## 2024-10-03 MED ORDER — LACTATED RINGERS IV SOLN: INTRAVENOUS | Status: DC | PRN

## 2024-10-03 MED ORDER — HYDROMORPHONE HCL 1 MG/ML IJ SOLN
0.2500 mg | INTRAMUSCULAR | Status: DC | PRN
  Administered 2024-10-03 (×2): 0.5 mg via INTRAVENOUS

## 2024-10-03 MED ORDER — METRONIDAZOLE 500 MG/100ML IV SOLN
500.0000 mg | Freq: Once | INTRAVENOUS | Status: DC
  Filled 2024-10-03: qty 100

## 2024-10-03 MED ORDER — OXYCODONE-ACETAMINOPHEN 5-325 MG PO TABS: 1.0000 | ORAL_TABLET | ORAL | 0 refills | Status: AC | PRN

## 2024-10-03 MED ORDER — PROPOFOL 10 MG/ML IV BOLUS
INTRAVENOUS | Status: AC
  Filled 2024-10-03: qty 20

## 2024-10-03 MED ORDER — ONDANSETRON HCL 4 MG/2ML IJ SOLN
INTRAMUSCULAR | Status: AC
  Filled 2024-10-03: qty 2

## 2024-10-03 MED ORDER — LIDOCAINE 2% (20 MG/ML) 5 ML SYRINGE
INTRAMUSCULAR | Status: DC | PRN
  Administered 2024-10-03: 70 mg via INTRAVENOUS

## 2024-10-03 MED ORDER — ONDANSETRON HCL 4 MG/2ML IJ SOLN
INTRAMUSCULAR | Status: DC | PRN
  Administered 2024-10-03: 4 mg via INTRAVENOUS

## 2024-10-03 MED ORDER — MIDAZOLAM HCL 2 MG/2ML IJ SOLN
INTRAMUSCULAR | Status: AC
  Filled 2024-10-03: qty 2

## 2024-10-03 MED ORDER — SODIUM CHLORIDE 0.9 % IV SOLN
3.0000 g | Freq: Once | INTRAVENOUS | Status: AC
  Administered 2024-10-03: 3 g via INTRAVENOUS
  Filled 2024-10-03: qty 8

## 2024-10-03 MED ORDER — ACETAMINOPHEN 10 MG/ML IV SOLN
INTRAVENOUS | Status: DC | PRN
  Administered 2024-10-03: 1000 mg via INTRAVENOUS

## 2024-10-03 MED ORDER — DEXAMETHASONE SOD PHOSPHATE PF 10 MG/ML IJ SOLN
INTRAMUSCULAR | Status: DC | PRN
  Administered 2024-10-03: 10 mg via INTRAVENOUS

## 2024-10-03 MED ORDER — OXYCODONE HCL 5 MG/5ML PO SOLN: 5.0000 mg | Freq: Once | ORAL | Status: AC | PRN

## 2024-10-03 MED ORDER — OXYCODONE HCL 5 MG PO TABS
5.0000 mg | ORAL_TABLET | Freq: Once | ORAL | Status: AC | PRN
  Administered 2024-10-03: 5 mg via ORAL

## 2024-10-03 MED ORDER — FENTANYL CITRATE (PF) 250 MCG/5ML IJ SOLN
INTRAMUSCULAR | Status: AC
  Filled 2024-10-03: qty 5

## 2024-10-03 MED ORDER — SODIUM CHLORIDE 0.9 % IV SOLN: INTRAVENOUS | Status: DC | PRN

## 2024-10-03 MED ORDER — SUCCINYLCHOLINE CHLORIDE 200 MG/10ML IV SOSY
PREFILLED_SYRINGE | INTRAVENOUS | Status: AC
  Filled 2024-10-03: qty 10

## 2024-10-03 MED ORDER — ROCURONIUM BROMIDE 10 MG/ML (PF) SYRINGE
PREFILLED_SYRINGE | INTRAVENOUS | Status: AC
  Filled 2024-10-03: qty 10

## 2024-10-03 MED ORDER — TETANUS-DIPHTH-ACELL PERTUSSIS 5-2-15.5 LF-MCG/0.5 IM SUSP
0.5000 mL | Freq: Once | INTRAMUSCULAR | Status: AC
  Administered 2024-10-03: 0.5 mL via INTRAMUSCULAR
  Filled 2024-10-03: qty 0.5

## 2024-10-03 MED ORDER — FENTANYL CITRATE (PF) 250 MCG/5ML IJ SOLN
INTRAMUSCULAR | Status: DC | PRN
  Administered 2024-10-03 (×3): 50 ug via INTRAVENOUS
  Administered 2024-10-03: 100 ug via INTRAVENOUS

## 2024-10-03 MED ORDER — PROPOFOL 10 MG/ML IV BOLUS
INTRAVENOUS | Status: DC | PRN
Start: 2024-10-03 — End: 2024-10-03
  Administered 2024-10-03: 170 mg via INTRAVENOUS

## 2024-10-03 MED ORDER — HYDROMORPHONE HCL 1 MG/ML IJ SOLN
0.5000 mg | Freq: Once | INTRAMUSCULAR | Status: AC
  Administered 2024-10-03: 0.5 mg via INTRAVENOUS
  Filled 2024-10-03: qty 1

## 2024-10-03 MED ORDER — SODIUM CHLORIDE 0.9 % IR SOLN
Status: DC | PRN
  Administered 2024-10-03: 3000 mL

## 2024-10-03 MED ORDER — 0.9 % SODIUM CHLORIDE (POUR BTL) OPTIME
TOPICAL | Status: DC | PRN
  Administered 2024-10-03: 1000 mL

## 2024-10-03 SURGICAL SUPPLY — 56 items
BAG COUNTER SPONGE SURGICOUNT (BAG) ×1 IMPLANT
BNDG COHESIVE 1X5 TAN STRL LF (GAUZE/BANDAGES/DRESSINGS) IMPLANT
BNDG COMPR ESMARK 4X3 LF (GAUZE/BANDAGES/DRESSINGS) ×1 IMPLANT
BNDG ELASTIC 3INX 5YD STR LF (GAUZE/BANDAGES/DRESSINGS) ×1 IMPLANT
BNDG ELASTIC 3X5.8 VLCR NS LF (GAUZE/BANDAGES/DRESSINGS) IMPLANT
BNDG ELASTIC 4X5.8 VLCR STR LF (GAUZE/BANDAGES/DRESSINGS) ×1 IMPLANT
BNDG GAUZE DERMACEA FLUFF 4 (GAUZE/BANDAGES/DRESSINGS) ×1 IMPLANT
CORD BIPOLAR FORCEPS 12FT (ELECTRODE) ×1 IMPLANT
COVER SURGICAL LIGHT HANDLE (MISCELLANEOUS) ×1 IMPLANT
CUFF TOURN SGL QUICK 18X4 (TOURNIQUET CUFF) ×1 IMPLANT
CUFF TRNQT CYL 24X4X16.5-23 (TOURNIQUET CUFF) IMPLANT
DRAIN PENROSE 12X.25 LTX STRL (MISCELLANEOUS) IMPLANT
DRAPE ARTHROSCOPY W/POUCH 114 (DRAPES) IMPLANT
DRAPE SURG 17X23 STRL (DRAPES) ×1 IMPLANT
DRAPE XRAY CASSETTE 23X24 (DRAPES) IMPLANT
DRSG ADAPTIC 3X8 NADH LF (GAUZE/BANDAGES/DRESSINGS) ×1 IMPLANT
ELECTRODE REM PT RTRN 9FT ADLT (ELECTROSURGICAL) IMPLANT
GAUZE SPONGE 4X4 12PLY STRL (GAUZE/BANDAGES/DRESSINGS) ×1 IMPLANT
GAUZE STRETCH 2X75IN STRL (MISCELLANEOUS) IMPLANT
GAUZE XEROFORM 1X8 LF (GAUZE/BANDAGES/DRESSINGS) ×1 IMPLANT
GAUZE XEROFORM 5X9 LF (GAUZE/BANDAGES/DRESSINGS) IMPLANT
GLOVE BIO SURGEON STRL SZ7 (GLOVE) IMPLANT
GLOVE BIOGEL PI IND STRL 8.5 (GLOVE) ×1 IMPLANT
GLOVE SURG ORTHO 8.0 STRL STRW (GLOVE) ×1 IMPLANT
GOWN STRL REUS W/ TWL LRG LVL3 (GOWN DISPOSABLE) ×3 IMPLANT
GOWN STRL REUS W/ TWL XL LVL3 (GOWN DISPOSABLE) ×1 IMPLANT
KIT BASIN OR (CUSTOM PROCEDURE TRAY) ×1 IMPLANT
KIT TURNOVER KIT B (KITS) ×1 IMPLANT
MANIFOLD NEPTUNE II (INSTRUMENTS) ×1 IMPLANT
NDL HYPO 25GX1X1/2 BEV (NEEDLE) IMPLANT
NEEDLE HYPO 25GX1X1/2 BEV (NEEDLE) ×1 IMPLANT
PACK ORTHO EXTREMITY (CUSTOM PROCEDURE TRAY) ×1 IMPLANT
PAD ARMBOARD POSITIONER FOAM (MISCELLANEOUS) ×2 IMPLANT
PAD CAST 4YDX4 CTTN HI CHSV (CAST SUPPLIES) ×1 IMPLANT
SET CYSTO W/LG BORE CLAMP LF (SET/KITS/TRAYS/PACK) IMPLANT
SET HNDPC FAN SPRY TIP SCT (DISPOSABLE) IMPLANT
SOAP 2 % CHG 4 OZ (WOUND CARE) ×1 IMPLANT
SOLN 0.9% NACL 1000 ML (IV SOLUTION) ×1 IMPLANT
SOLN 0.9% NACL POUR BTL 1000ML (IV SOLUTION) ×1 IMPLANT
SOLN STERILE WATER 1000 ML (IV SOLUTION) ×1 IMPLANT
SOLN STERILE WATER BTL 1000 ML (IV SOLUTION) ×1 IMPLANT
SPLINT FIBERGLASS 3X35 (CAST SUPPLIES) IMPLANT
SPONGE T-LAP 18X18 ~~LOC~~+RFID (SPONGE) ×1 IMPLANT
SPONGE T-LAP 4X18 ~~LOC~~+RFID (SPONGE) ×1 IMPLANT
SUT ETHILON 3 0 PS 1 (SUTURE) IMPLANT
SUT ETHILON 4 0 PS 2 18 (SUTURE) IMPLANT
SUT NYLON ETHILON 5-0 P-3 1X18 (SUTURE) IMPLANT
SWAB COLLECTION DEVICE MRSA (MISCELLANEOUS) ×1 IMPLANT
SWAB CULTURE ESWAB REG 1ML (MISCELLANEOUS) IMPLANT
SYR CONTROL 10ML LL (SYRINGE) IMPLANT
TOWEL GREEN STERILE (TOWEL DISPOSABLE) ×1 IMPLANT
TOWEL GREEN STERILE FF (TOWEL DISPOSABLE) ×1 IMPLANT
TRAY CATH INTERMITTENT SS 16FR (CATHETERS) IMPLANT
TUBE CONNECTING 12X1/4 (SUCTIONS) ×1 IMPLANT
UNDERPAD 30X36 HEAVY ABSORB (UNDERPADS AND DIAPERS) ×1 IMPLANT
YANKAUER SUCT BULB TIP NO VENT (SUCTIONS) ×1 IMPLANT

## 2024-10-03 NOTE — Op Note (Signed)
 PREOPERATIVE DIAGNOSIS: Right forearm dog bite with extensive lacerations  POSTOPERATIVE DIAGNOSIS: Same  ATTENDING SURGEON: Dr. Prentice Pagan who scrubbed and present for the entire procedure  ASSISTANT SURGEON: None  ANESTHESIA: General Via LMA  OPERATIVE PROCEDURE: Right forearm median nerve exploration and decompression forearm Right forearm excisional debridement skin subcutaneous tissue muscle and tendon excisional debridement Right forearm rotational flap closure 7 x 10 cm wound Right forearm laceration repair complex laceration 2-1/2 x 1 cm Right forearm laceration repair complex laceration 3-1/2 x 1 cm Right forearm dorsal laceration complex laceration repair 2-1/2 cm x 1 cm Right forearm simple laceration repair 1 x 1 cm Right forearm simple laceration repair 1 x 1 cm Right forearm complex laceration repair 4-1/2 x 2 cm Right forearm simple laceration repair 2 x 2 cm Right forearm simple laceration 1 x 1 cm Right forearm complex laceration repair 3 cm x 2 cm Right forearm ulnar border 2 x 2 cm complex laceration repair Right forearm ulnar border 2 x 2 cm complex laceration repair  IMPLANTS: None  EBL: Minimal  RADIOGRAPHIC INTERPRETATION: None none  SURGICAL INDICATIONS: Patient is a right-hand-dominant female who sustained the above injury from the dog bite.  Patient seen and evaluated recommend undergo the above procedure.  Risks of surgery include but not limited to bleeding infection damage nearby nerves arteries or tendons loss of motion wrist and digits incomplete relief of symptoms and need for further surgical invention.  SURGICAL TECHNIQUE: The patient was prepped identified in the preoperative holding area marked the prior marker made on the right forearm to indicate the correct operative site.  Patient then brought back the operating placed supine on the anesthesia table where the general anesthetic was administered.  Patient tolerates well.  A well-padded  tourniquet placed on the right brachium and seal with the appropriate drape.  The right upper extremities then prepped and draped normal sterile fashion.  A timeout was called the correct site identified the procedure then began.  Attention was then turned to the right forearm.  The 6 x 9 cm wound was debrided excisional [place] of the skin subtenons tissue and muscle and tendon was then carried out was done with sharp scissors and a knife.  This was done of the devitalized tissue.  This left the wound bed approximately 7 x 10 cm.  After excisional debridement the wounds were thoroughly irrigated with the pulsatile lavage.  This was done of all the lacerations.  Once this was carried out and thorough wound irrigation was then done and excisional debridement was done of all the skin surfaces and the lacerations attention was then turned to repair.  The median nerve and been carefully explored in the large wound and this was in continuity.  The patient did have the muscle damage to the flexor pronator region extending into the flexor tendons but did not have any evidence of nerve deficit.  Attention was then turned from a distal direction and then moving toward a proximal direction laceration and complex laceration was then repaired of the above-noted lacerations.  Several more simple lacerations and others were just complex in terms of the soft tissue rearrangement.  These were closed with 3-0 nylon suture.  After the laceration was repaired as noted above the wounds were then thoroughly irrigated.  In order to close the defect a rotational flap was then created in the forearm this flap was then rotated and advanced and closed with simple Prolene sutures.  Xeroform dressing sterile compressive bandage was  applied.  The patient was then placed in a well-padded volar splint patient is then extubated taken recovery room in good condition.  POSTOPERATIVE PLAN: Patient discharged to home.  See her back in the office in 8  days for wound check back into the splint.  Total of 2 weeks immobilization likely sutures were to be removed at the 2-week mark.

## 2024-10-03 NOTE — ED Notes (Signed)
 Animal Control Office Camellia Caroline confirms dog was UTD on vaccines, vaccinated on 10/19/22, valid until 10/19/25.

## 2024-10-03 NOTE — H&P (Signed)
 Angelica Alexander is an 53 y.o. female.   Chief Complaint: Right arm dog bite HPI: Pt sustained complex wound to right forearm Pt with large open wound Pt here for surgery  Past Medical History:  Diagnosis Date   Anxiety    Heroin use    Hypertension    Overdose     Past Surgical History:  Procedure Laterality Date   ABDOMINAL HYSTERECTOMY     CESAREAN SECTION     VESICOVAGINAL FISTULA CLOSURE W/ TAH      Family History  Problem Relation Age of Onset   Breast cancer Maternal Aunt    Breast cancer Maternal Grandmother    Social History:  reports that she has never smoked. She has never used smokeless tobacco. She reports current alcohol use. She reports current drug use.  Allergies:  Allergies  Allergen Reactions   Penicillins     CHILDHOOD ALLERGIES    (Not in a hospital admission)   Results for orders placed or performed during the hospital encounter of 10/03/24 (from the past 48 hours)  CBC with Differential     Status: Abnormal   Collection Time: 10/03/24  5:48 PM  Result Value Ref Range   WBC 16.1 (H) 4.0 - 10.5 K/uL   RBC 4.34 3.87 - 5.11 MIL/uL   Hemoglobin 11.9 (L) 12.0 - 15.0 g/dL   HCT 63.9 63.9 - 53.9 %   MCV 82.9 80.0 - 100.0 fL   MCH 27.4 26.0 - 34.0 pg   MCHC 33.1 30.0 - 36.0 g/dL   RDW 85.4 88.4 - 84.4 %   Platelets 395 150 - 400 K/uL   nRBC 0.0 0.0 - 0.2 %   Neutrophils Relative % 86 %   Neutro Abs 13.9 (H) 1.7 - 7.7 K/uL   Lymphocytes Relative 7 %   Lymphs Abs 1.1 0.7 - 4.0 K/uL   Monocytes Relative 5 %   Monocytes Absolute 0.8 0.1 - 1.0 K/uL   Eosinophils Relative 1 %   Eosinophils Absolute 0.1 0.0 - 0.5 K/uL   Basophils Relative 1 %   Basophils Absolute 0.1 0.0 - 0.1 K/uL   Immature Granulocytes 0 %   Abs Immature Granulocytes 0.07 0.00 - 0.07 K/uL    Comment: Performed at [facility], [address], [place], [place] [address]   DG Forearm Right Result Date: 10/03/2024 CLINICAL DATA:  Status post dog bite. EXAM: RIGHT  FOREARM - 2 VIEW COMPARISON:  None Available. FINDINGS: There is no evidence of an acute fracture or other focal bone lesions. Soft tissue swelling and extensive superficial soft tissue lacerations are seen along the volar aspect of the proximal and mid right forearm. Soft tissue swelling of the dorsal aspect of the mid right forearm is also seen. IMPRESSION: 1. No acute osseous abnormality. 2. Soft tissue swelling and extensive superficial soft tissue lacerations along the volar aspect of the proximal and mid right forearm. Electronically Signed   By: Suzen Dials M.D.   On: 10/03/2024 18:26    ROS NO RECENT ILLNESSES OR HOSPITALIZATIONS  Blood pressure (!) 194/119, pulse 65, temperature 98.4 F (36.9 C), resp. rate (!) 22, height 5' 2 (1.575 m), weight 72.6 kg, SpO2 100%. Physical Exam  General Appearance:  Alert, cooperative, no distress, appears stated age  Head:  Normocephalic, without obvious abnormality, atraumatic  Eyes:  Pupils equal, conjunctiva/corneas clear,         Throat: Lips, mucosa, and tongue normal; teeth and gums normal  Neck: No  visible masses     Lungs:   respirations unlabored  Chest Wall:  No tenderness or deformity  Heart:  Regular rate and rhythm,  Abdomen:   Soft, non-tender,         Extremities: Pictures in chart, able to wiggle fingers  Pulses: 2+ and symmetric  Skin: Skin color, texture, turgor normal, no rashes or lesions     Neurologic: Normal     Assessment/Plan RIGHT FOREARM COMPLEX DOG BITE WITH LARGE SOFT TISSUE WOUND VOLARLY  RIGHT FOREARM IRRIGATION AND DEBRIDEMENT AND REPAIR AS INDICATED  R/B/A DISCUSSED WITH PT IN THE HOSPITAL  PT VOICED UNDERSTANDING OF PLAN CONSENT SIGNED DAY OF SURGERY PT SEEN AND EXAMINED PRIOR TO OPERATIVE PROCEDURE/DAY OF SURGERY SITE MARKED. QUESTIONS ANSWERED WILL REMAIN AN INPATIENT FOLLOWING SURGERY   WE ARE PLANNING SURGERY FOR YOUR UPPER EXTREMITY. THE RISKS AND BENEFITS OF SURGERY INCLUDE BUT NOT  LIMITED TO BLEEDING INFECTION, DAMAGE TO NEARBY NERVES ARTERIES TENDONS, FAILURE OF SURGERY TO ACCOMPLISH ITS INTENDED GOALS, PERSISTENT SYMPTOMS AND NEED FOR FURTHER SURGICAL INTERVENTION. WITH THIS IN MIND WE WILL PROCEED. I HAVE DISCUSSED WITH THE PATIENT THE PRE AND POSTOPERATIVE REGIMEN AND THE DOS AND DON'TS. PT VOICED UNDERSTANDING AND INFORMED CONSENT SIGNED.   [name] 10/03/2024, 7:08 PM

## 2024-10-03 NOTE — ED Provider Notes (Signed)
 [place] EMERGENCY DEPARTMENT AT [facility] Provider Note  CSN: [id number] Arrival date & time: 10/03/24 1710  Chief Complaint(s) Animal Bite  HPI Angelica Alexander is a 53 y.o. female history of polysubstance abuse, hypertension presenting to the emergency department with dog bite.  Patient was apparently bit by her own dog.  Was bit in the right forearm.  Dog up-to-date on vaccines.  Reports some tingling to her middle finger, index and ring fingers.  Reports significant pain and discomfort.  Unknown last tetanus.  Did have some bleeding as well.  This happened today.   Past Medical History Past Medical History:  Diagnosis Date   Anxiety    Heroin use    Hypertension    Overdose    There are no active problems to display for this patient.  Home Medication(s) Prior to Admission medications   Medication Sig Start Date End Date Taking? Authorizing Provider  HYDROcodone -acetaminophen  (NORCO/VICODIN) 5-325 MG per tablet Take 1 tablet by mouth every 4 (four) hours as needed for pain. 09/30/13   Idol, Julie, PA-C  ibuprofen  (ADVIL ,MOTRIN ) 600 MG tablet Take 1 tablet (600 mg total) by mouth every 6 (six) hours as needed. 06/01/18   Alva Larraine FALCON, PA-C                                                                                                                                    Past Surgical History Past Surgical History:  Procedure Laterality Date   ABDOMINAL HYSTERECTOMY     CESAREAN SECTION     VESICOVAGINAL FISTULA CLOSURE W/ TAH     Family History Family History  Problem Relation Age of Onset   Breast cancer Maternal Aunt    Breast cancer Maternal Grandmother     Social History Social History   Tobacco Use   Smoking status: Never   Smokeless tobacco: Never  Vaping Use   Vaping status: Every Day  Substance Use Topics   Alcohol use: Yes    Comment: socially   Drug use: Yes    Comment: heroin   Allergies Penicillins  Review of Systems Review of  Systems  All other systems reviewed and are negative.   Physical Exam Vital Signs  I have reviewed the triage vital signs BP (!) 189/112 (BP Location: Right Arm)   Pulse 87   Temp 98.2 F (36.8 C) (Oral)   Resp 18   Ht 5' 2 (1.575 m)   Wt 72.6 kg   SpO2 100%   BMI 29.26 kg/m  Physical Exam Vitals and nursing note reviewed.  Constitutional:      General: She is not in acute distress.    Appearance: She is well-developed.  HENT:     Head: Normocephalic and atraumatic.     Mouth/Throat:     Mouth: Mucous membranes are moist.  Eyes:     Pupils: Pupils are equal, round, and reactive to light.  Cardiovascular:     Rate and Rhythm: Normal rate and regular rhythm.     Heart sounds: No murmur heard.    Comments: Distal 2+ bilateral radial pulse. Pulmonary:     Effort: Pulmonary effort is normal. No respiratory distress.     Breath sounds: Normal breath sounds.  Abdominal:     General: Abdomen is flat.     Palpations: Abdomen is soft.     Tenderness: There is no abdominal tenderness.  Musculoskeletal:        General: No tenderness.     Right lower leg: No edema.     Left lower leg: No edema.     Comments: Large soft tissue defect to the palmar aspect of the proximal right forearm with multiple lacerations.  No obvious exposed bone or tendon but significant muscle damage.  See media.  Flexion/extension seems intact at the PIP, DIP and MCP joints of the right hand, although painful.  Subjective decrease sensation to distal median nerve distribution  Skin:    General: Skin is warm and dry.  Neurological:     General: No focal deficit present.     Mental Status: She is alert. Mental status is at baseline.  Psychiatric:        Mood and Affect: Mood normal.        Behavior: Behavior normal.     ED Results and Treatments Labs (all labs ordered are listed, but only abnormal results are displayed) Labs Reviewed  CBC WITH DIFFERENTIAL/PLATELET - Abnormal; Notable for the  following components:      Result Value   WBC 16.1 (*)    Hemoglobin 11.9 (*)    Neutro Abs 13.9 (*)    All other components within normal limits  BASIC METABOLIC PANEL WITH GFR - Abnormal; Notable for the following components:   Glucose, Bld 115 (*)    Creatinine, Ser 1.24 (*)    GFR, Estimated 53 (*)    All other components within normal limits                                                                                                                          Radiology DG Forearm Right Result Date: 10/03/2024 CLINICAL DATA:  Status post dog bite. EXAM: RIGHT FOREARM - 2 VIEW COMPARISON:  None Available. FINDINGS: There is no evidence of an acute fracture or other focal bone lesions. Soft tissue swelling and extensive superficial soft tissue lacerations are seen along the volar aspect of the proximal and mid right forearm. Soft tissue swelling of the dorsal aspect of the mid right forearm is also seen. IMPRESSION: 1. No acute osseous abnormality. 2. Soft tissue swelling and extensive superficial soft tissue lacerations along the volar aspect of the proximal and mid right forearm. Electronically Signed   By: Suzen Dials M.D.   On: 10/03/2024 18:26    Pertinent labs & imaging results that were available during my care of the patient were reviewed by me  and considered in my medical decision making (see MDM for details).  Medications Ordered in ED Medications  metroNIDAZOLE (FLAGYL) IVPB 500 mg (has no administration in time range)  fentaNYL (SUBLIMAZE) injection 50 mcg (50 mcg Intravenous Given 10/03/24 1731)  sodium chloride  0.9 % bolus 1,000 mL (1,000 mLs Intravenous New Bag/Given 10/03/24 1924)  Tdap (ADACEL) injection 0.5 mL (0.5 mLs Intramuscular Given 10/03/24 1917)  cefTRIAXone (ROCEPHIN) 2 g in sodium chloride  0.9 % 100 mL IVPB (0 g Intravenous Stopped 10/03/24 1954)  HYDROmorphone (DILAUDID) injection 0.5 mg (0.5 mg Intravenous Given 10/03/24 1806)                                                                                                                                      Procedures Procedures  (including critical care time)  Medical Decision Making / ED Course   MDM:  53 year old presenting to the emergency department after dog bite.  Patient overall well-appearing, but uncomfortable.  Examination shows a very large soft tissue defect.  X-ray without fracture or foreign body.  Anticipate patient will need extensive washout and exploration in the OR.  Discussed with hand surgery Dr. Shari who has taken the patient for this procedure.  Patient received antibiotics, allergic to penicillin so received ceftriaxone and Flagyl.  Tetanus has been updated.  Pain has been controlled.      Additional history obtained: -Additional history obtained from ems -External records from outside source obtained and reviewed including: Chart review including previous notes, labs, imaging, consultation notes including prior ER visit   Lab Tests: -I ordered, reviewed, and interpreted labs.   The pertinent results include:   Labs Reviewed  CBC WITH DIFFERENTIAL/PLATELET - Abnormal; Notable for the following components:      Result Value   WBC 16.1 (*)    Hemoglobin 11.9 (*)    Neutro Abs 13.9 (*)    All other components within normal limits  BASIC METABOLIC PANEL WITH GFR - Abnormal; Notable for the following components:   Glucose, Bld 115 (*)    Creatinine, Ser 1.24 (*)    GFR, Estimated 53 (*)    All other components within normal limits    Notable for leukocytosis   Imaging Studies ordered: I ordered imaging studies including XR forearm On my interpretation imaging demonstrates no fx  I independently visualized and interpreted imaging. I agree with the radiologist interpretation   Medicines ordered and prescription drug management: Meds ordered this encounter  Medications   fentaNYL (SUBLIMAZE) injection 50 mcg   sodium chloride  0.9 % bolus  1,000 mL   Tdap (ADACEL) injection 0.5 mL   cefTRIAXone (ROCEPHIN) 2 g in sodium chloride  0.9 % 100 mL IVPB    Antibiotic Indication::   Other Indication (list below)   metroNIDAZOLE (FLAGYL) IVPB 500 mg    Antibiotic Indication::   Other Indication (list below)   HYDROmorphone (DILAUDID) injection 0.5 mg    -  I have reviewed the patients home medicines and have made adjustments as needed   Consultations Obtained: I requested consultation with the hand surgery,  and discussed lab and imaging findings as well as pertinent plan - they recommend: washout in OR    Reevaluation: After the interventions noted above, I reevaluated the patient and found that their symptoms have improved  Co morbidities that complicate the patient evaluation  Past Medical History:  Diagnosis Date   Anxiety    Heroin use    Hypertension    Overdose       Dispostion: Disposition decision including need for hospitalization was considered, and patient taken to OR    Final Clinical Impression(s) / ED Diagnoses Final diagnoses:  Dog bite of right forearm, initial encounter     This chart was dictated using voice recognition software.  Despite best efforts to proofread,  errors can occur which can change the documentation meaning.    Francesca Elsie CROME, MD 10/03/24 2003

## 2024-10-03 NOTE — ED Triage Notes (Signed)
 Patient was attacked by her own dog after intervening when it bit a friend. Patient reportedly has a chunk of tissue missing from her right forearm. Patient is alert and oriented but diaphoretic and hypertensive.

## 2024-10-03 NOTE — Discharge Instructions (Signed)
KEEP BANDAGE CLEAN AND DRY °CALL OFFICE FOR F/U APPT [phone] in 8 days °KEEP HAND ELEVATED ABOVE HEART °OK TO APPLY ICE TO OPERATIVE AREA °CONTACT OFFICE IF ANY WORSENING PAIN OR CONCERNS. °

## 2024-10-03 NOTE — Anesthesia Procedure Notes (Signed)
 Procedure Name: LMA Insertion Date/Time: 10/03/2024 8:38 PM  Performed by: Lylia Schuyler BROCKS, CRNAPre-anesthesia Checklist: Patient identified, Emergency Drugs available, Suction available and Patient being monitored Patient Re-evaluated:Patient Re-evaluated prior to induction Oxygen Delivery Method: Circle system utilized Preoxygenation: Pre-oxygenation with 100% oxygen Induction Type: IV induction LMA: LMA inserted LMA Size: 4.0 Placement Confirmation: positive ETCO2 and breath sounds checked- equal and bilateral Tube secured with: Tape Dental Injury: Teeth and Oropharynx as per pre-operative assessment

## 2024-10-03 NOTE — ED Notes (Signed)
 Report caLLED to todd in the OR to [facility] no further questions at this time.

## 2024-10-03 NOTE — Transfer of Care (Signed)
 Immediate Anesthesia Transfer of Care Note  Patient: Angelica Alexander  Procedure(s) Performed: IRRIGATION AND DEBRIDEMENT RIGHT FOREARM AND CLOSURE OF THE WOUNDS (Right: Arm Lower)  Patient Location: PACU  Anesthesia Type:General  Level of Consciousness: drowsy and patient cooperative  Airway & Oxygen Therapy: Patient Spontanous Breathing  Post-op Assessment: Report given to RN, Post -op Vital signs reviewed and stable, and Patient moving all extremities  Post vital signs: Reviewed and stable  Last Vitals:  Vitals Value Taken Time  BP 164/109 10/03/24 21:45  Temp    Pulse 80 10/03/24 21:48  Resp 14 10/03/24 21:48  SpO2 97 % 10/03/24 21:48  Vitals shown include unfiled device data.  Last Pain:  Vitals:   10/03/24 1914  TempSrc: Oral  PainSc:          Complications: No notable events documented.

## 2024-10-03 NOTE — Anesthesia Preprocedure Evaluation (Addendum)
 Anesthesia Evaluation  Patient identified by MRN, date of birth, ID band Patient awake    Reviewed: Allergy & Precautions, NPO status , Patient's Chart, lab work & pertinent test results  Airway Mallampati: II  TM Distance: >3 FB Neck ROM: Full    Dental  (+) Dental Advisory Given, Edentulous Upper   Pulmonary neg pulmonary ROS   Pulmonary exam normal breath sounds clear to auscultation       Cardiovascular hypertension,  Rhythm:Regular Rate:Normal     Neuro/Psych  PSYCHIATRIC DISORDERS Anxiety     negative neurological ROS     GI/Hepatic negative GI ROS,neg GERD  ,,(+)     substance abuse  marijuana use  Endo/Other  negative endocrine ROS    Renal/GU Renal InsufficiencyRenal disease     Musculoskeletal negative musculoskeletal ROS (+)    Abdominal   Peds  Hematology negative hematology ROS (+)   Anesthesia Other Findings   Reproductive/Obstetrics                              Anesthesia Physical Anesthesia Plan  ASA: 2 and emergent  Anesthesia Plan: General   Post-op Pain Management: Tylenol  PO (pre-op)*   Induction: Intravenous  PONV Risk Score and Plan: 4 or greater and Ondansetron, Dexamethasone, Treatment may vary due to age or medical condition and Midazolam  Airway Management Planned: LMA  Additional Equipment:   Intra-op Plan:   Post-operative Plan: Extubation in OR  Informed Consent: I have reviewed the patients History and Physical, chart, labs and discussed the procedure including the risks, benefits and alternatives for the proposed anesthesia with the patient or authorized representative who has indicated his/her understanding and acceptance.     Dental advisory given  Plan Discussed with: CRNA  Anesthesia Plan Comments:          Anesthesia Quick Evaluation

## 2024-10-04 ENCOUNTER — Encounter (HOSPITAL_COMMUNITY): Payer: Self-pay | Admitting: Orthopedic Surgery

## 2024-10-04 NOTE — Anesthesia Postprocedure Evaluation (Signed)
 d Anesthesia Post Note  Patient: Angelica Alexander  Procedure(s) Performed: IRRIGATION AND DEBRIDEMENT RIGHT FOREARM AND CLOSURE OF THE WOUNDS (Right: Arm Lower)     Patient location during evaluation: PACU Anesthesia Type: General Level of consciousness: sedated and patient cooperative Pain management: pain level controlled Vital Signs Assessment: post-procedure vital signs reviewed and stable Respiratory status: spontaneous breathing Cardiovascular status: stable Anesthetic complications: no   No notable events documented.  Last Vitals:  Vitals:   10/03/24 2228 10/03/24 2230  BP: (!) 175/103 (!) 175/103  Pulse: 89 85  Resp: 17   Temp: 36.7 C   SpO2: 98% 100%    Last Pain:  Vitals:   10/03/24 2215  TempSrc:   PainSc: 6                  Norleen Pope

## 2024-10-11 DIAGNOSIS — T1490XD Injury, unspecified, subsequent encounter: Secondary | ICD-10-CM | POA: Diagnosis not present

## 2024-10-16 DIAGNOSIS — G4709 Other insomnia: Secondary | ICD-10-CM | POA: Diagnosis not present

## 2024-10-28 ENCOUNTER — Other Ambulatory Visit: Payer: Self-pay

## 2024-10-28 ENCOUNTER — Emergency Department (HOSPITAL_COMMUNITY)
Admission: EM | Admit: 2024-10-28 | Discharge: 2024-10-28 | Disposition: A | Discharge status: Home / Self Care | Attending: Emergency Medicine | Admitting: Emergency Medicine

## 2024-10-28 DIAGNOSIS — T8131XA Disruption of external operation (surgical) wound, not elsewhere classified, initial encounter: Secondary | ICD-10-CM | POA: Insufficient documentation

## 2024-10-28 DIAGNOSIS — Y828 Other medical devices associated with adverse incidents: Secondary | ICD-10-CM | POA: Insufficient documentation

## 2024-10-28 DIAGNOSIS — T8130XA Disruption of wound, unspecified, initial encounter: Secondary | ICD-10-CM

## 2024-10-28 NOTE — ED Triage Notes (Signed)
 Pt states that she had surgery on her right arm 2 weeks ago. A few nights ago hit arm on something and thinks it busted but didn't take dressing off to look at it. Tonight she was separating her dog from another dog and fell onto arm. Pt states she wants to make sure that she didn't do anything worse to arm and to make sure incision is not infected. Pt states she hasn't been looking at arm or changing dressing until tonight. Redness, swelling noted to arm

## 2024-10-28 NOTE — ED Provider Notes (Signed)
 [place] EMERGENCY DEPARTMENT AT [facility] Provider Note   CSN: [id number] Arrival date & time: 10/28/24  8061     Patient presents with: Arm Injury   Angelica Alexander is a 53 y.o. female.   Patient is a 53 year old female who presents to the emergency department for evaluation of her surgical site to her right forearm.  Approximate 3 weeks ago she did experience a dog bite and underwent surgical repair of multiple lacerations to her right forearm.  Patient notes that approximately 2-3 nights ago she hit her arm on the refrigerator and feels as though she busted open some of her sutures.  She notes that tonight while attempting to break up another dog fight she reinjured the arm again and wanted to have it evaluated to ensure that the area is not becoming infected.  She denies any fever, chills or pain at the site at this time.   Arm Injury      Prior to Admission medications   Medication Sig Start Date End Date Taking? Authorizing Provider  amoxicillin-clavulanate (AUGMENTIN) 875-125 MG tablet Take 1 tablet by mouth 2 (two) times daily. 10/03/24   Shari Easter, MD  oxyCODONE-acetaminophen  (PERCOCET) 5-325 MG tablet Take 1 tablet by mouth every 4 (four) hours as needed for severe pain (pain score 7-10). 10/03/24 10/03/25  Shari Easter, MD    Allergies: Penicillins    Review of Systems  Musculoskeletal:        Separation of surgical site  All other systems reviewed and are negative.   Updated Vital Signs BP (!) 173/107 (BP Location: Left Arm)   Pulse 91   Temp 98.4 F (36.9 C) (Oral)   Resp 20   Ht 5' 2 (1.575 m)   Wt 69.4 kg   SpO2 100%   BMI 27.98 kg/m   Physical Exam Vitals and nursing note reviewed.  Constitutional:      Appearance: Normal appearance.  HENT:     Head: Normocephalic and atraumatic.  Eyes:     Extraocular Movements: Extraocular movements intact.     Conjunctiva/sclera: Conjunctivae normal.     Pupils: Pupils are equal, round,  and reactive to light.  Cardiovascular:     Rate and Rhythm: Normal rate and regular rhythm.     Pulses: Normal pulses.  Pulmonary:     Effort: Pulmonary effort is normal. No respiratory distress.  Musculoskeletal:        General: No swelling, tenderness or deformity. Normal range of motion.  Skin:    General: Skin is warm and dry.     Comments: Surgical site to the volar aspect of the right forearm demonstrates dehiscence of a small area of the wound, inflammatory changes noted around the surgical site with no true areas of induration, fluctuance, no purulent discharge, no lymphatic streaking  Neurological:     General: No focal deficit present.     Mental Status: She is alert and oriented to person, place, and time. Mental status is at baseline.     (all labs ordered are listed, but only abnormal results are displayed) Labs Reviewed - No data to display  EKG: None  Radiology: No results found.   Procedures   Medications Ordered in the ED - No data to display                                  Medical Decision Making Patient is doing well  at this time and is stable for discharge home.  No clear indication for underlying cellulitis or abscess formation is noted at this time.  There are changes consistent with the healing process.  Do not suspect antibiotics are warranted.  Patient has no bony tenderness throughout and no tenderness directly over the area.  There is no changes to temperature of the affected arm as well.  She already has close follow-up with Dr. Ortman in 2 days.  She was directed to keep this appointment and to return to emergency department immediately for any new or worsening symptoms.  The area was redressed in the emergency department.  Patient was fully evaluated by attending physician who is in agreement to plan of well.        Final diagnoses:  None    ED Discharge Orders     None          Daralene Lonni JONETTA DEVONNA 10/28/24 2125     Franklyn Sid SAILOR, MD 10/28/24 [id number]

## 2024-10-28 NOTE — Discharge Instructions (Signed)
 Please follow-up closely with your orthopedic surgeon on an outpatient basis.  Return to emergency department immediately for any new or worsening symptoms.

## 2024-10-30 DIAGNOSIS — G4709 Other insomnia: Secondary | ICD-10-CM | POA: Diagnosis not present

## 2024-11-20 DIAGNOSIS — G4709 Other insomnia: Secondary | ICD-10-CM | POA: Diagnosis not present

## 2024-12-03 DIAGNOSIS — G4709 Other insomnia: Secondary | ICD-10-CM | POA: Diagnosis not present
